# Patient Record
Sex: Male | Born: 1960 | Race: White | Hispanic: No | Marital: Married
Health system: Southern US, Community
[De-identification: ages and names within clinical notes are randomized; demographics above are authoritative.]

---

## 2018-05-21 DIAGNOSIS — I2699 Other pulmonary embolism without acute cor pulmonale: Secondary | ICD-10-CM

## 2018-05-21 DIAGNOSIS — I4891 Unspecified atrial fibrillation: Secondary | ICD-10-CM

## 2018-05-21 DIAGNOSIS — R0602 Shortness of breath: Secondary | ICD-10-CM

## 2018-05-21 DIAGNOSIS — F10239 Alcohol dependence with withdrawal, unspecified: Secondary | ICD-10-CM

## 2018-05-25 DIAGNOSIS — F1010 Alcohol abuse, uncomplicated: Secondary | ICD-10-CM

## 2018-05-25 DIAGNOSIS — I8500 Esophageal varices without bleeding: Secondary | ICD-10-CM

## 2018-05-25 DIAGNOSIS — K766 Portal hypertension: Secondary | ICD-10-CM

## 2018-05-25 DIAGNOSIS — Z720 Tobacco use: Secondary | ICD-10-CM

## 2018-05-25 DIAGNOSIS — D696 Thrombocytopenia, unspecified: Secondary | ICD-10-CM

## 2018-05-25 DIAGNOSIS — K7460 Unspecified cirrhosis of liver: Secondary | ICD-10-CM

## 2018-05-25 DIAGNOSIS — N179 Acute kidney failure, unspecified: Secondary | ICD-10-CM

## 2018-05-25 DIAGNOSIS — Z8719 Personal history of other diseases of the digestive system: Secondary | ICD-10-CM

## 2018-05-25 DIAGNOSIS — R768 Other specified abnormal immunological findings in serum: Secondary | ICD-10-CM

## 2018-05-26 DIAGNOSIS — F1010 Alcohol abuse, uncomplicated: Secondary | ICD-10-CM

## 2018-05-26 DIAGNOSIS — D696 Thrombocytopenia, unspecified: Secondary | ICD-10-CM

## 2018-05-26 DIAGNOSIS — Z720 Tobacco use: Secondary | ICD-10-CM

## 2018-05-26 DIAGNOSIS — I509 Heart failure, unspecified: Secondary | ICD-10-CM

## 2018-05-26 DIAGNOSIS — I4891 Unspecified atrial fibrillation: Secondary | ICD-10-CM

## 2018-05-26 DIAGNOSIS — I2699 Other pulmonary embolism without acute cor pulmonale: Secondary | ICD-10-CM

## 2018-05-26 DIAGNOSIS — K7460 Unspecified cirrhosis of liver: Secondary | ICD-10-CM

## 2018-05-29 DIAGNOSIS — I480 Paroxysmal atrial fibrillation: Secondary | ICD-10-CM

## 2018-05-29 DIAGNOSIS — I2699 Other pulmonary embolism without acute cor pulmonale: Secondary | ICD-10-CM

## 2018-05-29 DIAGNOSIS — I5031 Acute diastolic (congestive) heart failure: Secondary | ICD-10-CM

## 2018-06-03 DIAGNOSIS — I4891 Unspecified atrial fibrillation: Secondary | ICD-10-CM

## 2018-06-03 DIAGNOSIS — I509 Heart failure, unspecified: Secondary | ICD-10-CM

## 2018-09-03 DIAGNOSIS — K7460 Unspecified cirrhosis of liver: Secondary | ICD-10-CM

## 2018-09-03 DIAGNOSIS — I2699 Other pulmonary embolism without acute cor pulmonale: Secondary | ICD-10-CM

## 2018-09-03 DIAGNOSIS — I10 Essential (primary) hypertension: Secondary | ICD-10-CM

## 2018-09-03 DIAGNOSIS — I8500 Esophageal varices without bleeding: Secondary | ICD-10-CM

## 2018-09-03 DIAGNOSIS — I4891 Unspecified atrial fibrillation: Secondary | ICD-10-CM

## 2018-09-03 DIAGNOSIS — D696 Thrombocytopenia, unspecified: Secondary | ICD-10-CM

## 2018-09-03 DIAGNOSIS — I5031 Acute diastolic (congestive) heart failure: Secondary | ICD-10-CM

## 2018-09-03 DIAGNOSIS — F1010 Alcohol abuse, uncomplicated: Secondary | ICD-10-CM | POA: Diagnosis not present

## 2018-09-03 DIAGNOSIS — K766 Portal hypertension: Secondary | ICD-10-CM

## 2018-09-03 DIAGNOSIS — R768 Other specified abnormal immunological findings in serum: Secondary | ICD-10-CM

## 2018-09-04 DIAGNOSIS — I5031 Acute diastolic (congestive) heart failure: Secondary | ICD-10-CM | POA: Diagnosis not present

## 2018-09-04 DIAGNOSIS — F1010 Alcohol abuse, uncomplicated: Secondary | ICD-10-CM | POA: Diagnosis not present

## 2018-09-04 DIAGNOSIS — K7460 Unspecified cirrhosis of liver: Secondary | ICD-10-CM | POA: Diagnosis not present

## 2018-09-04 DIAGNOSIS — I4891 Unspecified atrial fibrillation: Secondary | ICD-10-CM | POA: Diagnosis not present

## 2018-09-05 DIAGNOSIS — F1010 Alcohol abuse, uncomplicated: Secondary | ICD-10-CM | POA: Diagnosis not present

## 2018-09-05 DIAGNOSIS — K7460 Unspecified cirrhosis of liver: Secondary | ICD-10-CM | POA: Diagnosis not present

## 2018-09-05 DIAGNOSIS — I5031 Acute diastolic (congestive) heart failure: Secondary | ICD-10-CM | POA: Diagnosis not present

## 2018-09-05 DIAGNOSIS — I4891 Unspecified atrial fibrillation: Secondary | ICD-10-CM | POA: Diagnosis not present

## 2018-09-06 DIAGNOSIS — K7290 Hepatic failure, unspecified without coma: Secondary | ICD-10-CM

## 2018-09-06 DIAGNOSIS — I5031 Acute diastolic (congestive) heart failure: Secondary | ICD-10-CM | POA: Diagnosis not present

## 2018-09-06 DIAGNOSIS — I480 Paroxysmal atrial fibrillation: Secondary | ICD-10-CM

## 2018-09-06 DIAGNOSIS — F10239 Alcohol dependence with withdrawal, unspecified: Secondary | ICD-10-CM

## 2018-09-06 DIAGNOSIS — N179 Acute kidney failure, unspecified: Secondary | ICD-10-CM

## 2018-09-06 DIAGNOSIS — K767 Hepatorenal syndrome: Secondary | ICD-10-CM | POA: Diagnosis not present

## 2018-09-07 ENCOUNTER — Inpatient Hospital Stay (HOSPITAL_COMMUNITY): Payer: Medicaid Other

## 2018-09-07 ENCOUNTER — Inpatient Hospital Stay (HOSPITAL_COMMUNITY)
Admission: AD | Admit: 2018-09-07 | Discharge: 2018-09-22 | DRG: 207 | Disposition: E | Payer: Medicaid Other | Source: Other Acute Inpatient Hospital | Attending: Pulmonary Disease | Admitting: Pulmonary Disease

## 2018-09-07 DIAGNOSIS — F17200 Nicotine dependence, unspecified, uncomplicated: Secondary | ICD-10-CM | POA: Diagnosis present

## 2018-09-07 DIAGNOSIS — B182 Chronic viral hepatitis C: Secondary | ICD-10-CM | POA: Diagnosis present

## 2018-09-07 DIAGNOSIS — E871 Hypo-osmolality and hyponatremia: Secondary | ICD-10-CM

## 2018-09-07 DIAGNOSIS — J9690 Respiratory failure, unspecified, unspecified whether with hypoxia or hypercapnia: Secondary | ICD-10-CM

## 2018-09-07 DIAGNOSIS — K439 Ventral hernia without obstruction or gangrene: Secondary | ICD-10-CM | POA: Diagnosis present

## 2018-09-07 DIAGNOSIS — Z515 Encounter for palliative care: Secondary | ICD-10-CM | POA: Diagnosis present

## 2018-09-07 DIAGNOSIS — A419 Sepsis, unspecified organism: Secondary | ICD-10-CM | POA: Diagnosis not present

## 2018-09-07 DIAGNOSIS — K7031 Alcoholic cirrhosis of liver with ascites: Secondary | ICD-10-CM | POA: Diagnosis present

## 2018-09-07 DIAGNOSIS — Z452 Encounter for adjustment and management of vascular access device: Secondary | ICD-10-CM | POA: Diagnosis not present

## 2018-09-07 DIAGNOSIS — Z9189 Other specified personal risk factors, not elsewhere classified: Secondary | ICD-10-CM

## 2018-09-07 DIAGNOSIS — R161 Splenomegaly, not elsewhere classified: Secondary | ICD-10-CM | POA: Diagnosis present

## 2018-09-07 DIAGNOSIS — Z978 Presence of other specified devices: Secondary | ICD-10-CM

## 2018-09-07 DIAGNOSIS — J9601 Acute respiratory failure with hypoxia: Principal | ICD-10-CM

## 2018-09-07 DIAGNOSIS — I5032 Chronic diastolic (congestive) heart failure: Secondary | ICD-10-CM | POA: Diagnosis present

## 2018-09-07 DIAGNOSIS — N179 Acute kidney failure, unspecified: Secondary | ICD-10-CM | POA: Diagnosis present

## 2018-09-07 DIAGNOSIS — E872 Acidosis: Secondary | ICD-10-CM | POA: Diagnosis present

## 2018-09-07 DIAGNOSIS — K767 Hepatorenal syndrome: Secondary | ICD-10-CM | POA: Diagnosis not present

## 2018-09-07 DIAGNOSIS — Z86711 Personal history of pulmonary embolism: Secondary | ICD-10-CM | POA: Diagnosis not present

## 2018-09-07 DIAGNOSIS — J449 Chronic obstructive pulmonary disease, unspecified: Secondary | ICD-10-CM | POA: Diagnosis present

## 2018-09-07 DIAGNOSIS — E119 Type 2 diabetes mellitus without complications: Secondary | ICD-10-CM | POA: Diagnosis present

## 2018-09-07 DIAGNOSIS — Z9119 Patient's noncompliance with other medical treatment and regimen: Secondary | ICD-10-CM

## 2018-09-07 DIAGNOSIS — D539 Nutritional anemia, unspecified: Secondary | ICD-10-CM | POA: Diagnosis present

## 2018-09-07 DIAGNOSIS — Z66 Do not resuscitate: Secondary | ICD-10-CM | POA: Diagnosis present

## 2018-09-07 DIAGNOSIS — I8500 Esophageal varices without bleeding: Secondary | ICD-10-CM | POA: Diagnosis present

## 2018-09-07 DIAGNOSIS — R579 Shock, unspecified: Secondary | ICD-10-CM | POA: Diagnosis present

## 2018-09-07 DIAGNOSIS — I480 Paroxysmal atrial fibrillation: Secondary | ICD-10-CM

## 2018-09-07 DIAGNOSIS — Z9289 Personal history of other medical treatment: Secondary | ICD-10-CM

## 2018-09-07 DIAGNOSIS — Z9114 Patient's other noncompliance with medication regimen: Secondary | ICD-10-CM

## 2018-09-07 DIAGNOSIS — F10239 Alcohol dependence with withdrawal, unspecified: Secondary | ICD-10-CM | POA: Diagnosis present

## 2018-09-07 DIAGNOSIS — I361 Nonrheumatic tricuspid (valve) insufficiency: Secondary | ICD-10-CM | POA: Diagnosis not present

## 2018-09-07 DIAGNOSIS — N400 Enlarged prostate without lower urinary tract symptoms: Secondary | ICD-10-CM | POA: Diagnosis present

## 2018-09-07 DIAGNOSIS — I110 Hypertensive heart disease with heart failure: Secondary | ICD-10-CM | POA: Diagnosis present

## 2018-09-07 DIAGNOSIS — R6521 Severe sepsis with septic shock: Secondary | ICD-10-CM | POA: Diagnosis not present

## 2018-09-07 DIAGNOSIS — K7040 Alcoholic hepatic failure without coma: Secondary | ICD-10-CM | POA: Diagnosis present

## 2018-09-07 DIAGNOSIS — I272 Pulmonary hypertension, unspecified: Secondary | ICD-10-CM | POA: Diagnosis present

## 2018-09-07 DIAGNOSIS — Z6841 Body Mass Index (BMI) 40.0 and over, adult: Secondary | ICD-10-CM

## 2018-09-07 DIAGNOSIS — Z9911 Dependence on respirator [ventilator] status: Secondary | ICD-10-CM

## 2018-09-07 DIAGNOSIS — I5031 Acute diastolic (congestive) heart failure: Secondary | ICD-10-CM | POA: Diagnosis not present

## 2018-09-07 DIAGNOSIS — R0602 Shortness of breath: Secondary | ICD-10-CM | POA: Diagnosis present

## 2018-09-07 DIAGNOSIS — D696 Thrombocytopenia, unspecified: Secondary | ICD-10-CM | POA: Diagnosis present

## 2018-09-07 DIAGNOSIS — L03311 Cellulitis of abdominal wall: Secondary | ICD-10-CM | POA: Diagnosis not present

## 2018-09-07 DIAGNOSIS — K7290 Hepatic failure, unspecified without coma: Secondary | ICD-10-CM | POA: Diagnosis not present

## 2018-09-07 DIAGNOSIS — Z8673 Personal history of transient ischemic attack (TIA), and cerebral infarction without residual deficits: Secondary | ICD-10-CM

## 2018-09-07 LAB — PROTIME-INR
INR: 2.01
Prothrombin Time: 22.5 seconds — ABNORMAL HIGH (ref 11.4–15.2)

## 2018-09-07 LAB — BLOOD GAS, ARTERIAL
Acid-Base Excess: 1.1 mmol/L (ref 0.0–2.0)
Bicarbonate: 25.9 mmol/L (ref 20.0–28.0)
Drawn by: 252031
FIO2: 60
LHR: 20 {breaths}/min
O2 Saturation: 86.8 %
PEEP: 5 cmH2O
Patient temperature: 94
Pressure control: 24 cmH2O
pCO2 arterial: 41.6 mmHg (ref 32.0–48.0)
pH, Arterial: 7.397 (ref 7.350–7.450)
pO2, Arterial: 49.1 mmHg — ABNORMAL LOW (ref 83.0–108.0)

## 2018-09-07 LAB — CBC WITH DIFFERENTIAL/PLATELET
Abs Immature Granulocytes: 0.04 10*3/uL (ref 0.00–0.07)
Basophils Absolute: 0 10*3/uL (ref 0.0–0.1)
Basophils Relative: 1 %
Eosinophils Absolute: 0.1 10*3/uL (ref 0.0–0.5)
Eosinophils Relative: 2 %
HCT: 32.2 % — ABNORMAL LOW (ref 39.0–52.0)
Hemoglobin: 10.6 g/dL — ABNORMAL LOW (ref 13.0–17.0)
Immature Granulocytes: 1 %
LYMPHS PCT: 10 %
Lymphs Abs: 0.6 10*3/uL — ABNORMAL LOW (ref 0.7–4.0)
MCH: 36.8 pg — ABNORMAL HIGH (ref 26.0–34.0)
MCHC: 32.9 g/dL (ref 30.0–36.0)
MCV: 111.8 fL — ABNORMAL HIGH (ref 80.0–100.0)
Monocytes Absolute: 1.1 10*3/uL — ABNORMAL HIGH (ref 0.1–1.0)
Monocytes Relative: 18 %
NEUTROS PCT: 68 %
Neutro Abs: 4.1 10*3/uL (ref 1.7–7.7)
Platelets: 103 10*3/uL — ABNORMAL LOW (ref 150–400)
RBC: 2.88 MIL/uL — ABNORMAL LOW (ref 4.22–5.81)
RDW: 16.9 % — ABNORMAL HIGH (ref 11.5–15.5)
WBC: 5.9 10*3/uL (ref 4.0–10.5)
nRBC: 0 % (ref 0.0–0.2)

## 2018-09-07 LAB — ABO/RH: ABO/RH(D): O POS

## 2018-09-07 LAB — COMPREHENSIVE METABOLIC PANEL
ALT: 21 U/L (ref 0–44)
AST: 32 U/L (ref 15–41)
Albumin: 3.5 g/dL (ref 3.5–5.0)
Alkaline Phosphatase: 77 U/L (ref 38–126)
Anion gap: 13 (ref 5–15)
BUN: 26 mg/dL — ABNORMAL HIGH (ref 6–20)
CO2: 25 mmol/L (ref 22–32)
CREATININE: 2.64 mg/dL — AB (ref 0.61–1.24)
Calcium: 8.4 mg/dL — ABNORMAL LOW (ref 8.9–10.3)
Chloride: 96 mmol/L — ABNORMAL LOW (ref 98–111)
GFR calc Af Amer: 30 mL/min — ABNORMAL LOW (ref >60)
GFR calc non Af Amer: 26 mL/min — ABNORMAL LOW (ref >60)
Glucose, Bld: 121 mg/dL — ABNORMAL HIGH (ref 70–99)
Potassium: 4.4 mmol/L (ref 3.5–5.1)
Sodium: 134 mmol/L — ABNORMAL LOW (ref 135–145)
Total Bilirubin: 3.8 mg/dL — ABNORMAL HIGH (ref 0.3–1.2)
Total Protein: 7.5 g/dL (ref 6.5–8.1)

## 2018-09-07 LAB — APTT: aPTT: 39 seconds — ABNORMAL HIGH (ref 24–36)

## 2018-09-07 LAB — GLUCOSE, CAPILLARY: GLUCOSE-CAPILLARY: 121 mg/dL — AB (ref 70–99)

## 2018-09-07 LAB — MAGNESIUM: Magnesium: 2.6 mg/dL — ABNORMAL HIGH (ref 1.7–2.4)

## 2018-09-07 LAB — TYPE AND SCREEN
ABO/RH(D): O POS
ANTIBODY SCREEN: NEGATIVE

## 2018-09-07 LAB — LACTIC ACID, PLASMA: Lactic Acid, Venous: 1.4 mmol/L (ref 0.5–1.9)

## 2018-09-07 LAB — TROPONIN I: Troponin I: <0.03 ng/mL (ref <0.03)

## 2018-09-07 LAB — PHOSPHORUS: Phosphorus: 5.1 mg/dL — ABNORMAL HIGH (ref 2.5–4.6)

## 2018-09-07 MED ORDER — IPRATROPIUM-ALBUTEROL 0.5-2.5 (3) MG/3ML IN SOLN
3.0000 mL | Freq: Four times a day (QID) | RESPIRATORY_TRACT | Status: DC
  Administered 2018-09-07 – 2018-09-10 (×10): 3 mL via RESPIRATORY_TRACT
  Filled 2018-09-07 (×10): qty 3

## 2018-09-07 MED ORDER — FENTANYL 2500MCG IN NS 250ML (10MCG/ML) PREMIX INFUSION
25.0000 ug/h | INTRAVENOUS | Status: DC
  Administered 2018-09-07: 50 ug/h via INTRAVENOUS
  Filled 2018-09-07: qty 250

## 2018-09-07 MED ORDER — LACTULOSE ENEMA
300.0000 mL | Freq: Two times a day (BID) | ORAL | Status: DC
  Administered 2018-09-07 – 2018-09-13 (×12): 300 mL via RECTAL
  Filled 2018-09-07 (×12): qty 300

## 2018-09-07 MED ORDER — NICOTINE 21 MG/24HR TD PT24
21.0000 mg | MEDICATED_PATCH | Freq: Every day | TRANSDERMAL | Status: DC
  Administered 2018-09-08: 21 mg via TRANSDERMAL
  Filled 2018-09-07: qty 1

## 2018-09-07 MED ORDER — FOLIC ACID 5 MG/ML IJ SOLN
1.0000 mg | Freq: Every day | INTRAMUSCULAR | Status: DC
  Administered 2018-09-08 – 2018-09-13 (×6): 1 mg via INTRAVENOUS
  Filled 2018-09-07 (×7): qty 0.2

## 2018-09-07 MED ORDER — NOREPINEPHRINE 4 MG/250ML-% IV SOLN
0.0000 ug/min | INTRAVENOUS | Status: DC
  Administered 2018-09-07: 2 ug/min via INTRAVENOUS
  Filled 2018-09-07 (×3): qty 250

## 2018-09-07 MED ORDER — FENTANYL CITRATE (PF) 100 MCG/2ML IJ SOLN: 50.0000 ug | Freq: Once | INTRAMUSCULAR | Status: DC

## 2018-09-07 MED ORDER — DOCUSATE SODIUM 50 MG/5ML PO LIQD
100.0000 mg | Freq: Two times a day (BID) | ORAL | Status: DC | PRN
  Administered 2018-09-11: 100 mg
  Filled 2018-09-07: qty 10

## 2018-09-07 MED ORDER — THIAMINE HCL 100 MG/ML IJ SOLN
100.0000 mg | Freq: Every day | INTRAMUSCULAR | Status: DC
  Administered 2018-09-08 – 2018-09-13 (×6): 100 mg via INTRAVENOUS
  Filled 2018-09-07 (×6): qty 2

## 2018-09-07 MED ORDER — FENTANYL 2500MCG IN NS 250ML (10MCG/ML) PREMIX INFUSION
25.0000 ug/h | INTRAVENOUS | Status: DC
  Administered 2018-09-08: 250 ug/h via INTRAVENOUS
  Administered 2018-09-09 (×2): 400 ug/h via INTRAVENOUS
  Administered 2018-09-09: 300 ug/h via INTRAVENOUS
  Administered 2018-09-10 (×3): 400 ug/h via INTRAVENOUS
  Administered 2018-09-11: 350 ug/h via INTRAVENOUS
  Administered 2018-09-11 – 2018-09-13 (×6): 400 ug/h via INTRAVENOUS
  Filled 2018-09-07 (×18): qty 250

## 2018-09-07 MED ORDER — FENTANYL BOLUS VIA INFUSION
50.0000 ug | INTRAVENOUS | Status: DC | PRN
  Filled 2018-09-07: qty 50

## 2018-09-07 MED ORDER — MIDAZOLAM HCL 2 MG/2ML IJ SOLN
INTRAMUSCULAR | Status: AC
  Administered 2018-09-07: 2 mg
  Filled 2018-09-07: qty 2

## 2018-09-07 MED ORDER — SODIUM CHLORIDE 0.9 % IV SOLN
2.0000 g | INTRAVENOUS | Status: DC
  Administered 2018-09-07 – 2018-09-12 (×6): 2 g via INTRAVENOUS
  Filled 2018-09-07 (×6): qty 20

## 2018-09-07 MED ORDER — FAMOTIDINE IN NACL 20-0.9 MG/50ML-% IV SOLN
20.0000 mg | Freq: Two times a day (BID) | INTRAVENOUS | Status: DC
  Filled 2018-09-07: qty 50

## 2018-09-07 MED ORDER — PANTOPRAZOLE SODIUM 40 MG IV SOLR
40.0000 mg | Freq: Two times a day (BID) | INTRAVENOUS | Status: DC
  Administered 2018-09-08 – 2018-09-13 (×12): 40 mg via INTRAVENOUS
  Filled 2018-09-07 (×12): qty 40

## 2018-09-07 MED ORDER — MIDAZOLAM HCL 2 MG/2ML IJ SOLN
2.0000 mg | Freq: Once | INTRAMUSCULAR | Status: AC
  Administered 2018-09-07: 2 mg via INTRAVENOUS

## 2018-09-07 MED ORDER — FENTANYL CITRATE (PF) 100 MCG/2ML IJ SOLN
50.0000 ug | Freq: Once | INTRAMUSCULAR | Status: AC
  Administered 2018-09-07: 50 ug via INTRAVENOUS

## 2018-09-07 MED ORDER — HEPARIN SODIUM (PORCINE) 5000 UNIT/ML IJ SOLN: 5000.0000 [IU] | Freq: Three times a day (TID) | INTRAMUSCULAR | Status: DC

## 2018-09-07 MED ORDER — NOREPINEPHRINE 4 MG/250ML-% IV SOLN
INTRAVENOUS | Status: AC
  Filled 2018-09-07: qty 250

## 2018-09-07 MED ORDER — MIDAZOLAM HCL 2 MG/2ML IJ SOLN
0.5000 mg | INTRAMUSCULAR | Status: DC | PRN
  Administered 2018-09-08 – 2018-09-12 (×6): 0.5 mg via INTRAVENOUS
  Filled 2018-09-07 (×8): qty 2

## 2018-09-07 MED ORDER — MIDAZOLAM HCL 2 MG/2ML IJ SOLN
1.0000 mg | INTRAMUSCULAR | Status: AC | PRN
  Administered 2018-09-07 – 2018-09-08 (×3): 1 mg via INTRAVENOUS
  Filled 2018-09-07 (×4): qty 2

## 2018-09-07 MED ORDER — FENTANYL BOLUS VIA INFUSION
50.0000 ug | INTRAVENOUS | Status: DC | PRN
  Administered 2018-09-07 – 2018-09-12 (×5): 50 ug via INTRAVENOUS
  Filled 2018-09-07: qty 50

## 2018-09-07 NOTE — Significant Event (Addendum)
Critical Care:  Patient [facility] from [facility] for suspected hepatic encephalopathy and hepatorenal syndrome MELD score 25. Hepatitis C and alcoholic cirrhosis with active drinking and no hepatology follow up.  Lynnell Catalanavi Zaley Talley, MD [facility] ICU Physician [facility]  Pager: [phone] Mobile: [phone] After hours: [phone].  08-23-2018, 6:14 PM

## 2018-09-07 NOTE — Progress Notes (Signed)
eLink Physician-Brief Progress Note Patient Name: Chipper HerbRobert Terrero DOB: [birth year] MRN: [id number]   Date of Service  06/09/18  HPI/Events of Note  57/M with liver disease, continued alcohol use, transferred from an outside facility for hepatic encephalopathy and possible hepatorenal syndrome.   Pt is intubated and breathing comfortably with the vent.  Labs not available for review.  eICU Interventions  Recheck labs - LFTs, CMP, ammonia.   Sedated while on the vent.  GI consult.     Intervention Category Evaluation Type: New Patient Evaluation  Larinda ButteryVanessa Jeyli Zwicker 06/09/18, 9:18 PM

## 2018-09-07 NOTE — Progress Notes (Signed)
ANTICOAGULATION CONSULT NOTE - Initial Consult  Pharmacy Consult for Heparin (Apixaban on hold) Indication: Atrial fibrillation, history of PE  Allergies not on file  Patient Measurements: 161.5 kg per RN  Vital Signs: Pulse Rate: 82 (12/17 2346)  Labs: Recent Labs    09/12/2018 2216  APTT 39*  LABPROT 22.5*  INR 2.01  CREATININE 2.64*  TROPONINI <0.03    CrCl cannot be calculated (Unknown ideal weight.).  Assessment: 57 y/o M transfer [facility] with likely worsening hepatic encephalopathy. He was on Apixaban PTA with questionable compliance. [facility] [place] getting [facility] [facility] with last dose 12/16 at 2038. Will likely need to use aPTT to dose given apixaban influence on heparin levels.   Goal of Therapy:  Heparin level 0.3-0.7 units/ml aPTT 66-102 seconds Monitor platelets by anticoagulation protocol: Yes   Plan:  Start heparin drip at 1400 units/hr 0830 aPTT/HL Daily CBC/HL/aPTT Monitor for bleeding   Abran DukeLedford, Oprah Camarena 09/08/2018,11:54 PM

## 2018-09-07 NOTE — Procedures (Signed)
Central Venous Catheter Insertion Procedure Note Jose Norris [id number] July 02, 1961  Procedure: Insertion of Central Venous Catheter Indications: Assessment of intravascular volume, Drug and/or fluid administration and Frequent blood sampling  Procedure Details Consent: Unable to obtain consent because of altered level of consciousness. Time Out: Verified patient identification, verified procedure, site/side was marked, verified correct patient position, special equipment/implants available, medications/allergies/relevent history reviewed, required imaging and test results available.  Performed  Maximum sterile technique was used including antiseptics, cap, gloves, gown, hand hygiene, mask and sheet. Skin prep: Chlorhexidine; local anesthetic administered A antimicrobial bonded/coated triple lumen catheter was placed in the left internal jugular vein using the Seldinger technique.  Evaluation Blood flow good Complications: No apparent complications Patient did tolerate procedure well. Chest X-ray ordered to verify placement.  CXR: pending.  Procedure performed under direct ultrasound guidance for real time vessel cannulation.      Jose Norris, [place] - [facility] Pager: [phone]  or [phone] 09/21/2018, 10:09 PM

## 2018-09-07 NOTE — H&P (Addendum)
..   NAME:  Jose HerbRobert Beadle, MRN:  [id number], DOB:  Nov 11, 1960, LOS: 0 ADMISSION DATE:  08/31/2018, CONSULTATION DATE:  09/01/2018 REFERRING MD:  DANFORD,CHRISTOPHER MD, CHIEF COMPLAINT:  RESP DISTRESS   Brief History   57 yr old M [facility]  Was admitted there on 09/03/18. Was being managed for progressive shortness of breath. Pt ran out of his fluid pills and borrowed someone else's but it was not effective. He was noted to have weight gain and palpitations for a week prior to presentation.   History of present illness   57 yr old M with PMHx Alcoholic liver cirrhosis, chronic Hep C, Esophageal varices, Afib, PE on Eliquis (per the chart not compliant with it for 3-4 weeks- per family patient had no health insurance and recently received Medicaid and was able to be seen at the hospital), diastolic CHF, HTN and DM presenting [facility]. Was admitted there on 09/03/18 for progressive shortness of breath and found to be in fluid overload. He ran out of his diuretic and borrowed someone else's and it was not working.  Upon presentation there he was in Afib RVR and started on cardizem gtt. He also received a large volume paracentesis followed by albumn 25 g IV x 1.  While in patient he was improving and was in stepdown when he became agitated and combative - suspected ETOH withdrawal . He received Haldol and Ativan at that time and was started on CIWA protocol. Per their report patient's respiratory status prior to this was tenuous and became progressively worse requiring ntubation.   Past Medical History  Alcoholic Liver cirrhosis Hepatitis C- viral load increased in comparison to prior Esophageal varices- EGD 05/25/18 PE- previously on Eliquis LE DVTs Atrial fibrillation(paroxysmal) DM HTN Diastolic CHF BPH  Significant Hospital Events   Prior to transport acute hypoxic resp failure - endotracheally intubated  Consults:  None at this time  Procedures:    Paracentesis - 09/03/18 in [facility] 60 cc for diagnostic evaluation followed by 5.5L and received one dose of 25g albumin at that time.  Significant Diagnostic Tests:  CXR: Endotracheal tube tip just below the level of the clavicles. Enteric tube side port projects over the stomach. Small left pleural effusion and left basilar consolidation.  Micro Data:  Pending Paracentesis results from [facility]- per Rn cx was negative. Cell count ( mod WBC) no numbers per RN  - blood cx x 2 - 12/17 - tracheal cx - 12/17  Antimicrobials:  - paper record does not indicate any previous abx- confirmed no anx at [facility] with RN there   - Ceftriaxone: 08/30/2018   Objective   There were no vitals taken for this visit.       No intake or output data in the 24 hours ending 09/14/2018 2125 There were no vitals filed for this visit.  Examination: General: sedated intubated HENT: NCAT with ETT and OGT in place  Lungs: coarse breath sounds bilaterally Cardiovascular: S1 and S2 irregularly irregular Abdomen: distended soft w/ + fluid wave hypoactive BS, overlying skin erythematous with dimpling and warm to the touch Extremities: +3 edema in lower extremities with erythema Neuro: GCS E 4 VNT M 5 GU: indwelling foley catheter with dark red urine.     Assessment & Plan:  Acute hypoxic respiratory failure: - intubated continues on mechanical ventilation - [facility] 7.44/41/68/29 - A/C TV 600/RR 16/ 50/5 - much more synchronous on PC on my evaluation - ABG w/in 1 hr to  assess oxygenation ventilation status  - VAPrecautions  - CXR completed ETT position post transport ok and right perihilar consolidation noted.  - h/o COPD will start on bronchodilators Q 6 H/o smoking - nicotine replacement via patches  Hypotensive on presentation- ? Septic shock - stopped propofol gtt - MAP <25mmhg will require vasopressors will continue on cardiac monitoring - LIJ CVC - monitor CVP  - currently in Afib -  trace pericardial effusion noted on previous imaging - get 2D ECHO to assess LVEF - will hold spironolactone/flomax/beta blockers given his hypotension  Liver cirrhosis (combination of alcohol induced injury and hep C) - on CT compared to Aug 2019 unchanged nodular hepatic contour noted w- MELD 23 (19.6% 3 month mortality)  - Hepatic encephalopathy  last Ammonia prior to transfer was 89 will start on Lactulose Enema BID. - Last INR 1.5 no active bleeding - no need for FFP at this time - Discriminative function. 24- based on his PT 15.5 Bilirubin 3- no indication for glucocorticoid therapy at this time  Hepatitis C h/o with increased viral load- if pt improves will need GI f/u as an outpatient.  H/o Alcoholism  Time of last drink prior to admission at [place]: noted to still be heavily drinking 8 beers/day  CIWA protocol (he was agitated and confused earlier and required Ativan and Haldol- of note QTc is prolonged- currently on Sedation protocol with Fentanyl gtt and Versed  Macrocytic anemia - hgb 10.7 MCV 107 most likely secondary to alcoholism - start on thiamine and folic acid Mod to large ascites with mesenteric edema and received paracentesis @ [place] ED- they sent cytology, cell count, gram stain and cultures. 5.5 L removed and pt received Albumin post We may reconsider repeating if pt's clinical status declines.  H/o esophageal varices  p/w OGT placed at [facility]  no evidence of hematemesis or bleeding from OG noted Was prev on octreotide- will discontinue at this time ( no evidence of active GIB) will start on PPI.  No beta blockers at this time given hypotension.   Hypoosmolar Hyponatremia: h/o beer drinking. Na had improved during admission to [place]. F/u Na on BMET.   Thrombocytopenia  - splenomegaly on CT Abd/pelvis - and in setting of cirrhosis - will continue to monitor - last plt count   Atrial Fibrillation (paroxysmal) - was previously in RVR at [place]  and required a Cardizem gtt. Did not tolerate drip and received IVF  Currently HR <80bpm  was transitioned to PO Cardizem 60 Q 6 prior to his decline. Will assess hemodynamics and resume Cardizem when MAP improves.  - CHADS2VASC- 4 ( HTN, diastolic CHF h/o PE and Diabetes in h/o) - high risk of stroke/TIA/Embolism- will need AC- starting on heparin gtt per pharmacy (was on Xarelto as an outpatient) if there is any drop in Hgb or signs of active bleeding we will discontinue AC given risk/benefit. - QTc prolonged stopped Propofol gtt which was started in transport - was on Cardizem   Acute kidney Injury Last Cr increased to 2.0 f/u rpt on labs F/u UOP  Non Anion Gap Metabolic Acidosis  AG corrected for Albumin 3.4 ->12.5 No diarrhea per chart/ report Has an OGT- was on intermittent suction- placed to gravity Has an indwelling foley with decreased output and noticeable hematuria with lots of sediment Send UA and C& S Has a h/o BPH will hold flomax at this time given his hypotension  Abdomen ?cellulitis - skin overlying lower pannus erythematous warm and suspicious for  cellulitis - monitor closely for extension/expanison - f/u blood cx - pt starting on ceftriaxone for SBP coverage will also cover possible gram + that may be reason for soft tissue infection    Best practice:  Diet: NPO Pain/Anxiety/Delirium protocol (if indicated): Fentanyl continuous and Versed  VAP protocol (if indicated): yes DVT prophylaxis: heparin gtt no bolus GI prophylaxis: PPI Glucose control: yes ISS Phase 1 protocol Mobility: bedrest Code Status: FULL Family Communication: discussed with sister at bedside Disposition:   Labs   CBC: No results for input(s): WBC, NEUTROABS, HGB, HCT, MCV, PLT in the last 168 hours.  Basic Metabolic Panel: No results for input(s): NA, K, CL, CO2, GLUCOSE, BUN, CREATININE, CALCIUM, MG, PHOS in the last 168 hours. GFR: CrCl cannot be calculated (No successful lab  value found.). No results for input(s): PROCALCITON, WBC, LATICACIDVEN in the last 168 hours.  Liver Function Tests: No results for input(s): AST, ALT, ALKPHOS, BILITOT, PROT, ALBUMIN in the last 168 hours. No results for input(s): LIPASE, AMYLASE in the last 168 hours. No results for input(s): AMMONIA in the last 168 hours.  ABG No results found for: PHART, PCO2ART, PO2ART, HCO3, TCO2, ACIDBASEDEF, O2SAT   Coagulation Profile: No results for input(s): INR, PROTIME in the last 168 hours.  Cardiac Enzymes: No results for input(s): CKTOTAL, CKMB, CKMBINDEX, TROPONINI in the last 168 hours.  HbA1C: No results found for: HGBA1C  CBG: Recent Labs  Lab 09/03/2018 2105  GLUCAP 121*    Review of Systems:   Marland KitchenMarland KitchenReview of Systems  Unable to perform ROS: Critical illness   Past Medical History  Alcoholic Liver cirrhosis Hepatitis C- viral load increased in comparison to prior Esophageal varices- EGD 05/25/18 PE- previously on Eliquis LE DVTs Atrial fibrillation(paroxysmal) DM HTN Diastolic CHF BPH  Surgical History   Pending.   Social History    Actively drinking for several years- 8 beers/day Smoker Married  Family History   His family history is not on file.   Allergies Allergies not on file   Home Medications  Prior to Admission medications   Not on File     Critical care time: 108 mins     I, Dr Newell Coral have personally reviewed patient's available data, including medical history, events of note, physical examination and test results as part of my evaluation. I have discussed with  other care providers such as pharmacist, PA and RN.    The patient is critically ill with multiple organ systems failure and requires high complexity decision making for assessment and support, frequent evaluation and titration of therapies, application of advanced monitoring technologies and extensive interpretation of multiple databases.   Critical Care Time devoted to  patient care services described in this note is 108 Minutes. This time reflects time of care of this signee Dr Newell Coral. This critical care time does not reflect procedure time, or teaching time or supervisory time of PA  but could involve care discussion time   Dr. Newell Coral Pulmonary Critical Care Medicine  09/14/2018 11:31 PM

## 2018-09-08 ENCOUNTER — Other Ambulatory Visit (HOSPITAL_COMMUNITY): Payer: Medicaid Other

## 2018-09-08 LAB — HEPATIC FUNCTION PANEL
ALT: 19 U/L (ref 0–44)
AST: 32 U/L (ref 15–41)
Albumin: 3.1 g/dL — ABNORMAL LOW (ref 3.5–5.0)
Alkaline Phosphatase: 68 U/L (ref 38–126)
Bilirubin, Direct: 1.1 mg/dL — ABNORMAL HIGH (ref 0.0–0.2)
Indirect Bilirubin: 2.1 mg/dL — ABNORMAL HIGH (ref 0.3–0.9)
Total Bilirubin: 3.2 mg/dL — ABNORMAL HIGH (ref 0.3–1.2)
Total Protein: 7.3 g/dL (ref 6.5–8.1)

## 2018-09-08 LAB — BASIC METABOLIC PANEL
Anion gap: 11 (ref 5–15)
BUN: 28 mg/dL — ABNORMAL HIGH (ref 6–20)
CO2: 26 mmol/L (ref 22–32)
CREATININE: 2.41 mg/dL — AB (ref 0.61–1.24)
Calcium: 8.3 mg/dL — ABNORMAL LOW (ref 8.9–10.3)
Chloride: 96 mmol/L — ABNORMAL LOW (ref 98–111)
GFR calc Af Amer: 34 mL/min — ABNORMAL LOW (ref >60)
GFR calc non Af Amer: 29 mL/min — ABNORMAL LOW (ref >60)
GLUCOSE: 110 mg/dL — AB (ref 70–99)
Potassium: 4.2 mmol/L (ref 3.5–5.1)
Sodium: 133 mmol/L — ABNORMAL LOW (ref 135–145)

## 2018-09-08 LAB — TROPONIN I
Troponin I: 0.03 ng/mL (ref <0.03)
Troponin I: <0.03 ng/mL (ref <0.03)

## 2018-09-08 LAB — GLUCOSE, CAPILLARY
GLUCOSE-CAPILLARY: 102 mg/dL — AB (ref 70–99)
Glucose-Capillary: 124 mg/dL — ABNORMAL HIGH (ref 70–99)

## 2018-09-08 LAB — BLOOD GAS, ARTERIAL
Acid-Base Excess: 2.4 mmol/L — ABNORMAL HIGH (ref 0.0–2.0)
Bicarbonate: 26.5 mmol/L (ref 20.0–28.0)
DRAWN BY: 44166
FIO2: 60
O2 Saturation: 92.3 %
PEEP: 5 cmH2O
Patient temperature: 96.8
Pressure control: 20 cmH2O
RATE: 20 resp/min
pCO2 arterial: 39.9 mmHg (ref 32.0–48.0)
pH, Arterial: 7.433 (ref 7.350–7.450)
pO2, Arterial: 62.8 mmHg — ABNORMAL LOW (ref 83.0–108.0)

## 2018-09-08 LAB — HEPARIN LEVEL (UNFRACTIONATED): Heparin Unfractionated: 1.84 IU/mL — ABNORMAL HIGH (ref 0.30–0.70)

## 2018-09-08 LAB — APTT
aPTT: 57 seconds — ABNORMAL HIGH (ref 24–36)
aPTT: 121 seconds — ABNORMAL HIGH (ref 24–36)

## 2018-09-08 LAB — HEMOGLOBIN A1C
Hgb A1c MFr Bld: 4.8 % (ref 4.8–5.6)
Mean Plasma Glucose: 91.06 mg/dL

## 2018-09-08 LAB — MAGNESIUM
MAGNESIUM: 2.6 mg/dL — AB (ref 1.7–2.4)
Magnesium: 2.5 mg/dL — ABNORMAL HIGH (ref 1.7–2.4)
Magnesium: 2.7 mg/dL — ABNORMAL HIGH (ref 1.7–2.4)

## 2018-09-08 LAB — CBC
HCT: 31.4 % — ABNORMAL LOW (ref 39.0–52.0)
Hemoglobin: 10.1 g/dL — ABNORMAL LOW (ref 13.0–17.0)
MCH: 35.6 pg — AB (ref 26.0–34.0)
MCHC: 32.2 g/dL (ref 30.0–36.0)
MCV: 110.6 fL — ABNORMAL HIGH (ref 80.0–100.0)
Platelets: 101 10*3/uL — ABNORMAL LOW (ref 150–400)
RBC: 2.84 MIL/uL — ABNORMAL LOW (ref 4.22–5.81)
RDW: 16.5 % — ABNORMAL HIGH (ref 11.5–15.5)
WBC: 6.7 10*3/uL (ref 4.0–10.5)
nRBC: 0 % (ref 0.0–0.2)

## 2018-09-08 LAB — PHOSPHORUS
PHOSPHORUS: 5.1 mg/dL — AB (ref 2.5–4.6)
Phosphorus: 5.6 mg/dL — ABNORMAL HIGH (ref 2.5–4.6)
Phosphorus: 6.3 mg/dL — ABNORMAL HIGH (ref 2.5–4.6)

## 2018-09-08 LAB — POCT I-STAT 3, ART BLOOD GAS (G3+)
ACID-BASE DEFICIT: 1 mmol/L (ref 0.0–2.0)
Bicarbonate: 28.5 mmol/L — ABNORMAL HIGH (ref 20.0–28.0)
O2 Saturation: 95 %
Patient temperature: 98.6
TCO2: 31 mmol/L (ref 22–32)
pCO2 arterial: 70.9 mmHg (ref 32.0–48.0)
pH, Arterial: 7.212 — ABNORMAL LOW (ref 7.350–7.450)
pO2, Arterial: 97 mmHg (ref 83.0–108.0)

## 2018-09-08 LAB — MRSA PCR SCREENING: MRSA by PCR: NEGATIVE

## 2018-09-08 LAB — LACTIC ACID, PLASMA: Lactic Acid, Venous: 1.3 mmol/L (ref 0.5–1.9)

## 2018-09-08 LAB — HIV ANTIBODY (ROUTINE TESTING W REFLEX): HIV Screen 4th Generation wRfx: NONREACTIVE

## 2018-09-08 LAB — AMMONIA: Ammonia: 34 umol/L (ref 9–35)

## 2018-09-08 MED ORDER — ADULT MULTIVITAMIN LIQUID CH
15.0000 mL | Freq: Every day | ORAL | Status: DC
  Administered 2018-09-08 – 2018-09-13 (×6): 15 mL via ORAL
  Filled 2018-09-08 (×6): qty 15

## 2018-09-08 MED ORDER — LORAZEPAM 2 MG/ML IJ SOLN
1.0000 mg | INTRAMUSCULAR | Status: DC | PRN
  Administered 2018-09-08 – 2018-09-12 (×18): 2 mg via INTRAVENOUS
  Filled 2018-09-08 (×21): qty 1

## 2018-09-08 MED ORDER — DIGOXIN 0.25 MG/ML IJ SOLN
0.2500 mg | Freq: Once | INTRAMUSCULAR | Status: AC
  Administered 2018-09-08: 0.25 mg via INTRAVENOUS
  Filled 2018-09-08: qty 2

## 2018-09-08 MED ORDER — VITAL HIGH PROTEIN PO LIQD
1000.0000 mL | ORAL | Status: DC
  Administered 2018-09-08 – 2018-09-10 (×2): 1000 mL

## 2018-09-08 MED ORDER — METOPROLOL TARTRATE 5 MG/5ML IV SOLN
5.0000 mg | INTRAVENOUS | Status: AC | PRN
  Administered 2018-09-08 – 2018-09-09 (×3): 5 mg via INTRAVENOUS
  Filled 2018-09-08 (×3): qty 5

## 2018-09-08 MED ORDER — ORAL CARE MOUTH RINSE
15.0000 mL | OROMUCOSAL | Status: DC
  Administered 2018-09-08 – 2018-09-13 (×54): 15 mL via OROMUCOSAL

## 2018-09-08 MED ORDER — PRO-STAT SUGAR FREE PO LIQD
30.0000 mL | Freq: Two times a day (BID) | ORAL | Status: DC
  Administered 2018-09-08 – 2018-09-13 (×10): 30 mL
  Filled 2018-09-08 (×10): qty 30

## 2018-09-08 MED ORDER — HEPARIN (PORCINE) 25000 UT/250ML-% IV SOLN
1500.0000 [IU]/h | INTRAVENOUS | Status: DC
  Administered 2018-09-08: 1600 [IU]/h via INTRAVENOUS
  Administered 2018-09-08 – 2018-09-09 (×2): 1400 [IU]/h via INTRAVENOUS
  Filled 2018-09-08 (×5): qty 250

## 2018-09-08 MED ORDER — ALBUMIN HUMAN 25 % IV SOLN
25.0000 g | Freq: Once | INTRAVENOUS | Status: AC
  Administered 2018-09-09: 25 g via INTRAVENOUS
  Filled 2018-09-08: qty 100

## 2018-09-08 MED ORDER — CHLORHEXIDINE GLUCONATE 0.12% ORAL RINSE (MEDLINE KIT)
15.0000 mL | Freq: Two times a day (BID) | OROMUCOSAL | Status: DC
  Administered 2018-09-08 – 2018-09-13 (×11): 15 mL via OROMUCOSAL

## 2018-09-08 NOTE — Progress Notes (Signed)
eLink Physician-Brief Progress Note Patient Name: [name] DOB: 05-16-1961 MRN: [id number]   Date of Service  09/08/2018  HPI/Events of Note  HR still elevated with digoxin.  eICU Interventions  Digoxin will take some time. Ordered metoprolol 5 mg IV     Intervention Category Major Interventions: Arrhythmia - evaluation and management  Rosalie Gumsmily T Loyda Costin 09/08/2018, 10:00 PM

## 2018-09-08 NOTE — Progress Notes (Addendum)
eLink Physician-Brief Progress Note Patient Name: Jose HerbRobert Voges DOB: 1961/06/29 MRN: [id number]   Date of Service  09/08/2018  HPI/Events of Note  Notified of afib RVR 150s. Patient seen intubated, sedated on Fentany. BP 119/68  eICU Interventions  Avoiding amiodarone given liver failure.  Will give a one time of Digoxin 0.25, will not load given renal insufficiency.     Intervention Category Major Interventions: Arrhythmia - evaluation and management  Darl Pikesmily T Moyses Pavey 09/08/2018, 8:23 PM

## 2018-09-08 NOTE — Progress Notes (Signed)
Initial Nutrition Assessment  DOCUMENTATION CODES:   Morbid obesity  INTERVENTION:   Tube Feeding:  Vital High Protein @ 65 ml/hr Pro-Stat 30 mL BID Provides 1760 kcals, 167 g of protein and 1310 mL of free water  Add MVI with minerals  NUTRITION DIAGNOSIS:   Inadequate oral intake related to acute illness as evidenced by NPO status.  GOAL:   Provide needs based on ASPEN/SCCM guidelines  MONITOR:   TF tolerance, Vent status, Labs, Weight trends, Skin  REASON FOR ASSESSMENT:   Ventilator    ASSESSMENT:   57 yo male admitted with acute respiratory failure [facility]. PMH includes alcoholic liver cirrhosis, chronic Hepatitis C, esophageal varices, Afib, PE, CHF, HTN, DM   12/13 [facility] with 5.5 L removed  Patient is currently intubated on ventilator support MV: 11.8 L/min Temp (24hrs), Avg:97 F (36.1 C), Min:94.3 F (34.6 C), Max:99.3 F (37.4 C)  Family, including patient's wife, at bedside; Family reports poor intake for 1 week (eating bites/sips) prior to [facility]. Pt eating some during that admission, including half a cheese burger and a few fries 24 hours prior to transfer [facility].   Family reports pt has experienced a significant amount of weight gain recently due to fluid gains "everywhere" including abdomen, buttock, legs, etc. Prior to this, pt had experienced weight loss of up to 40 pounds. Family reports it does appear that pt has lost weight in his face.   Nutrition focused physical exam deferred at this time per RN request as pt has been difficult to sedate  Labs: sodium 133, Creatinine 2.41, BUN 28, phosphorus 5.1 Meds: lactulose, folic acid, thiamine  NUTRITION - FOCUSED PHYSICAL EXAM:  Deferred until follow-up  Diet Order:   Diet Order            Diet NPO time specified  Diet effective now              EDUCATION NEEDS:   Not appropriate for  education at this time  Skin:  Skin Assessment: Reviewed RN Assessment  Last BM:  12/18  Height:   Ht Readings from Last 1 Encounters:  09/08/18 5\' 8"  (1.727 m)    Weight:   Wt Readings from Last 1 Encounters:  09/08/18 (!) 163 kg    Ideal Body Weight:  70 kg  BMI:  Body mass index is 54.64 kg/m.  Estimated Nutritional Needs:   Kcal:  1540-1750 kcals   Protein:  154-175 g  Fluid:  >/= 1.5 L   Romelle Starcherate Karie Skowron MS, RD, LDN, CNSC [phone] Pager  [phone] Weekend/On-Call Pager

## 2018-09-08 NOTE — Progress Notes (Signed)
ANTICOAGULATION CONSULT NOTE  Pharmacy Consult for Heparin Indication: Atrial fibrillation, history of PE  Patient Measurements: 161.5 kg per RN  Vital Signs: Temp: 98.1 F (36.7 C) (12/18 1119) Temp Source: Core (12/18 0800) BP: 105/61 (12/18 1119) Pulse Rate: 135 (12/18 1119)  Labs: Recent Labs    01-19-2018 2216 09/08/18 0524 09/08/18 1025  HGB 10.6* 10.1*  --   HCT 32.2* 31.4*  --   PLT 103* 101*  --   APTT 39*  --  57*  LABPROT 22.5*  --   --   INR 2.01  --   --   HEPARINUNFRC  --   --  1.84*  CREATININE 2.64* 2.41*  --   TROPONINI <0.03 0.03* <0.03     Assessment: 57 [facility] with likely worsening hepatic encephalopathy. He was on Apixaban PTA with questionable compliance. He was getting [facility] at [facility] with last dose 12/16 at 2038. Initial aPTT level is subtherapeutic. HL is high as expected from Eliquis.   Goal of Therapy:  Heparin level 0.3-0.7 units/ml aPTT 66-102 seconds Monitor platelets by anticoagulation protocol: Yes    Plan:  -Increase heparin gtt to 1600 units/hr -Daily HL, aPTT, CBC -Check aPTT in 6 hours   Dillyn Joaquin, Darl HouseholderAlison M 09/08/2018,1:34 PM

## 2018-09-08 NOTE — Progress Notes (Addendum)
..   NAME:  Jose Norris, MRN:  [id number], DOB:  03-Feb-1961, LOS: 1 ADMISSION DATE:  08/26/2018, CONSULTATION DATE:  09/05/2018 REFERRING MD:  DANFORD,CHRISTOPHER MD, CHIEF COMPLAINT:  RESP DISTRESS   Brief History   57 yr old M presenting from [place]  Was admitted there on 09/03/18. Was being managed for progressive shortness of breath. Pt ran out of his fluid pills and borrowed someone else's but it was not effective. He was noted to have weight gain and palpitations for a week prior to presentation.   History of present illness   57 yr old M with PMHx Alcoholic liver cirrhosis, chronic Hep C, Esophageal varices, Afib, PE on Eliquis (per the chart not compliant with it for 3-4 weeks- per family patient had no health insurance and recently received Medicaid and was able to be seen at the hospital), diastolic CHF, HTN and DM presenting from [facility]. Was admitted there on 09/03/18 for progressive shortness of breath and found to be in fluid overload. He ran out of his diuretic and borrowed someone else's and it was not working.  Upon presentation there he was in Afib RVR and started on cardizem gtt. He also received a large volume paracentesis followed by albumn 25 g IV x 1.  While in patient he was improving and was in stepdown when he became agitated and combative - suspected ETOH withdrawal . He received Haldol and Ativan at that time and was started on CIWA protocol. Per their report patient's respiratory status prior to this was tenuous and became progressively worse requiring ntubation.   Past Medical History  Alcoholic Liver cirrhosis Hepatitis C- viral load increased in comparison to prior Esophageal varices- EGD 05/25/18 PE- previously on Eliquis LE DVTs Atrial fibrillation(paroxysmal) DM HTN Diastolic CHF BPH  Significant Hospital Events   Prior to transport acute hypoxic resp failure - endotracheally intubated  Consults:  None at this time  Procedures:    Paracentesis - 09/03/18 in [place] [facility] 60 cc for diagnostic evaluation followed by 5.5L and received one dose of 25g albumin at that time.  Significant Diagnostic Tests:  CXR: Endotracheal tube tip just below the level of the clavicles. Enteric tube side port projects over the stomach. Small left pleural effusion and left basilar consolidation.  Micro Data:  Pending Paracentesis results from [place]- per Rn cx was negative. Cell count ( mod WBC) no numbers per RN  - blood cx x 2 - 12/17>> - tracheal cx - 12/17>>  Antimicrobials:  - paper record does not indicate any previous abx- confirmed no anx at [place] with RN there   - Ceftriaxone: 09/21/2018   Objective   Blood pressure 97/68, pulse (!) 112, temperature 99.3 F (37.4 C), resp. rate (!) 22, height 5\' 8"  (1.727 m), weight (!) 163 kg, SpO2 98 %. CVP:  [30 mmHg-34 mmHg] 30 mmHg  Vent Mode: PCV FiO2 (%):  [60 %] 60 % Set Rate:  [20 bmp-24 bmp] 20 bmp PEEP:  [5 cmH20] 5 cmH20 Plateau Pressure:  [24 cmH20-25 cmH20] 24 cmH20   Intake/Output Summary (Last 24 hours) at 09/08/2018 0916 Last data filed at 09/08/2018 0600 Gross per 24 hour  Intake 410.13 ml  Output 150 ml  Net 260.13 ml   Filed Weights   09/08/18 0000 09/08/18 0447  Weight: (!) 161.5 kg (!) 163 kg    Examination: General: Obese male who is heavily sedated on mechanical ventilatory support HEENT: Endotracheal tube is in place Neuro: Heavily sedated but grimaces and withdraws  to noxious stimuli CV: Sounds are regular irregular PULM: Decreased throughout GI: Distended, faint bowel sounds, fluid wave is appreciated.  Dressing in place from paracentesis Extremities: warm/dry, 3+  edema  Skin: Warm and dry      Assessment & Plan:  Acute hypoxic respiratory failure: -Vent bundle -Wean when stable -Underlying COPD will make this difficult -Underlying body habitus will create issues with extubation   Hypotensive on presentation- ? Septic shock -Evaluate  left ventricular function -Hold diuretics -Pressor support as needed  Liver cirrhosis (combination of alcohol induced injury and hep C) - on CT compared to Aug 2019 unchanged nodular hepatic contour noted w- MELD 23 (19.6% 3 month mortality)  - Hepatic encephalopathy  last Ammonia prior to transfer was 89 will start on Lactulose Enema BID. -Lactulose    Hepatitis C h/o with increased viral load- I -We will need outpatient follow-up   CIWA protocol for alcoholism Continue current treatment  Macrocytic anemia -Monitor hemoglobin -Thiamine folic acid Mod to large ascites with mesenteric edema and received paracentesis @ [facility]- they sent cytology, cell count, gram stain and cultures. 5.5 L removed and pt received Albumin post -continue to monitor -No need for repeat paracentesis at this time  H/o esophageal varices  [facility] No current evidence of GI bleed Proton pump inhibitor Start TF 09/08/17   Hypoosmolar Hyponatremia: h/o beer drinking. Na had improved during admission to [place].  -Monitor sodium currently 134  Thrombocytopenia  -Continue to monitor  Atrial Fibrillation (paroxysmal) -We will attempt to start low-dose Cardizem as needed  - CHADS2VASC- 4 ( HTN, diastolic CHF h/o PE and Diabetes in h/o) -Currently on anticoagulation with heparin -Currently in atrial fibrillation note he was on Cardizem as an outpatient as ventricular rate increases and if hemodynamic stable will start back on Cardizem   Acute kidney Injury Monitor creatinine Avoid nephrotoxic No need for renal consult at this time    Abdomen ?cellulitis -Day 1 of ceftriaxone -Culture data is pending -Monitor ventral hernia -No skin breakdown bilateral thighs lower sacrum    Best practice:  Diet: NPO Pain/Anxiety/Delirium protocol (if indicated): Fentanyl continuous and Versed  VAP protocol (if indicated): yes DVT prophylaxis: heparin gtt no  bolus GI prophylaxis: PPI Glucose control: yes ISS Phase 1 protocol Mobility: bedrest Code Status: FULL Family Communication: discussed with sister at bedside Disposition:   Labs   CBC: Recent Labs  Lab 09/11/2018 2216 09/08/18 0524  WBC 5.9 6.7  NEUTROABS 4.1  --   HGB 10.6* 10.1*  HCT 32.2* 31.4*  MCV 111.8* 110.6*  PLT 103* 101*    Basic Metabolic Panel: Recent Labs  Lab 09/06/2018 2216 09/08/18 0524  NA 134* 133*  K 4.4 4.2  CL 96* 96*  CO2 25 26  GLUCOSE 121* 110*  BUN 26* 28*  CREATININE 2.64* 2.41*  CALCIUM 8.4* 8.3*  MG 2.6* 2.5*  PHOS 5.1* 5.1*   GFR: Estimated Creatinine Clearance: 52 mL/min (A) (by C-G formula based on SCr of 2.41 mg/dL (H)). Recent Labs  Lab 09/21/2018 2216 08/24/2018 2235 09/08/18 0524  WBC 5.9  --  6.7  LATICACIDVEN  --  1.4 1.3    Liver Function Tests: Recent Labs  Lab 08/30/2018 2216  AST 32  ALT 21  ALKPHOS 77  BILITOT 3.8*  PROT 7.5  ALBUMIN 3.5   No results for input(s): LIPASE, AMYLASE in the last 168 hours. No results for input(s): AMMONIA in the last 168 hours.  ABG  Component Value Date/Time   PHART 7.433 09/08/2018 0325   PCO2ART 39.9 09/08/2018 0325   PO2ART 62.8 (L) 09/08/2018 0325   HCO3 26.5 09/08/2018 0325   O2SAT 92.3 09/08/2018 0325     Coagulation Profile: Recent Labs  Lab 09/10/2018 2216  INR 2.01    Cardiac Enzymes: Recent Labs  Lab Sep 10, 2018 2216 09/08/18 0524  TROPONINI <0.03 0.03*    HbA1C: Hgb A1c MFr Bld  Date/Time Value Ref Range Status  09/10/2018 10:35 PM 4.8 4.8 - 5.6 % Final    Comment:    (NOTE) Pre diabetes:          5.7%-6.4% Diabetes:              >6.4% Glycemic control for   <7.0% adults with diabetes     CBG: Recent Labs  Lab 09/10/18 2105 09/08/18 0010 09/08/18 0725  GLUCAP 121* 124* 102*   Critical care time: 30 mins    Steve Minor ACNP [facility] PCCM Pager [phone] till 1 pm If no answer page [phone] 09/08/2018, 9:17 AM  Attending  Note:  57 year old male transferred from [place] for SOB due to pulmonary edema.  On exam, diffuse crackles noted.  I reviewed CXR myself, ETT is in place with acute pulmonary edema noted.  Will continue with active diureses today.  Levophed for BP support.  Continue lactulose enema.  G-tube for TF given varices.  Correct hyponatremia.  F/U on ascites fluid.  PCCM will continue to follow.  The patient is critically ill with multiple organ systems failure and requires high complexity decision making for assessment and support, frequent evaluation and titration of therapies, application of advanced monitoring technologies and extensive interpretation of multiple databases.   Critical Care Time devoted to patient care services described in this note is  32  Minutes. This time reflects time of care of this signee Dr Koren Bound. This critical care time does not reflect procedure time, or teaching time or supervisory time of PA/NP/Med student/Med Resident etc but could involve care discussion time.  Alyson Reedy, M.D. [facility] Pulmonary/Critical Care Medicine. Pager: [phone]. After hours pager: [phone].

## 2018-09-08 NOTE — Progress Notes (Signed)
Arterial blood gas results given to Dr.Scatliffe. Vent changes made.

## 2018-09-08 NOTE — Progress Notes (Signed)
ANTICOAGULATION CONSULT NOTE  Pharmacy Consult for Heparin Indication: Atrial fibrillation, history of PE  Patient Measurements: Ht 68 in Wt 164 kg IBW: 68.4 kg Heparin dosing wt: 109 kg  Vital Signs: Temp: 98.1 F (36.7 C) (12/18 2000) Temp Source: Core (12/18 1600) BP: 119/68 (12/18 2000) Pulse Rate: 155 (12/18 2000)  Labs: Recent Labs    2018-03-22 2216 09/08/18 0524 09/08/18 1025 09/08/18 2027  HGB 10.6* 10.1*  --   --   HCT 32.2* 31.4*  --   --   PLT 103* 101*  --   --   APTT 39*  --  57* 121*  LABPROT 22.5*  --   --   --   INR 2.01  --   --   --   HEPARINUNFRC  --   --  1.84*  --   CREATININE 2.64* 2.41*  --   --   TROPONINI <0.03 0.03* <0.03  --      Assessment: 57 [facility] old male from [facility] with likely worsening hepatic encephalopathy. He was on Apixaban PTA with questionable compliance. He was getting [facility] at [facility] with last dose 12/16 at 2038. Baseline PTT 39 sec. Heparin level elevated as expected from apixaban.  PTT up to 121 sec (supratherapeutic) on 1600 units/hr. Heparin drawn from arm opposite where heparin infusing. No bleeding noted.  Goal of Therapy:  Heparin level 0.3-0.7 units/ml aPTT 66-102 seconds Monitor platelets by anticoagulation protocol: Yes  Plan:  -Decrease heparin gtt to 1400 units/hr -Daily HL, aPTT, CBC  Christoper Fabianaron Sedric Guia, PharmD, BCPS Clinical pharmacist  **Pharmacist phone directory can now be found on [email or web address] ([email or web address]).  Listed under [facility]. 09/08/2018,9:06 PM

## 2018-09-08 NOTE — Care Management Note (Signed)
Case Management Note Hortencia ConradiWendi Terrianne Cavness, RN MSN CCM Transitions of Care [place]M [phone]  Patient Details  Name: Jose Norris MRN: [id number] Date of Birth: [birth year]  Subjective/Objective:          Alcoholic cirrhosis-ETOH abuse          Action/Plan: PTA home with family. Insurance listed as Medicaid. [facility] where paracentesis was done-5.5 L and G-tube placed.  Currently intubated. Ammonia level increased-lactolose enema. IV fentanyl and heparin continuous. Will continue to follow for transition of care needs.   Expected Discharge Date:                  Expected Discharge Plan:  Home w Home Health Services  In-House Referral:     Discharge planning Services  CM Consult  Post Acute Care Choice:    Choice offered to:     DME Arranged:    DME Agency:     HH Arranged:    HH Agency:     Status of Service:  In process, will continue to follow  If discussed at Long Length of Stay Meetings, dates discussed:    Additional Comments:  Bess KindsWendi B Raynah Gomes, RN 09/08/2018, 3:17 PM

## 2018-09-09 ENCOUNTER — Inpatient Hospital Stay (HOSPITAL_COMMUNITY): Payer: Medicaid Other

## 2018-09-09 DIAGNOSIS — I361 Nonrheumatic tricuspid (valve) insufficiency: Secondary | ICD-10-CM

## 2018-09-09 LAB — URINE CULTURE: Culture: NO GROWTH

## 2018-09-09 LAB — POCT I-STAT 3, ART BLOOD GAS (G3+)
Acid-Base Excess: 5 mmol/L — ABNORMAL HIGH (ref 0.0–2.0)
Bicarbonate: 30.4 mmol/L — ABNORMAL HIGH (ref 20.0–28.0)
O2 Saturation: 99 %
Patient temperature: 98.1
TCO2: 32 mmol/L (ref 22–32)
pCO2 arterial: 47.2 mmHg (ref 32.0–48.0)
pH, Arterial: 7.415 (ref 7.350–7.450)
pO2, Arterial: 150 mmHg — ABNORMAL HIGH (ref 83.0–108.0)

## 2018-09-09 LAB — BASIC METABOLIC PANEL
Anion gap: 11 (ref 5–15)
Anion gap: 12 (ref 5–15)
BUN: 34 mg/dL — ABNORMAL HIGH (ref 6–20)
BUN: 34 mg/dL — ABNORMAL HIGH (ref 6–20)
CO2: 25 mmol/L (ref 22–32)
CO2: 23 mmol/L (ref 22–32)
Calcium: 8.4 mg/dL — ABNORMAL LOW (ref 8.9–10.3)
Calcium: 8.1 mg/dL — ABNORMAL LOW (ref 8.9–10.3)
Chloride: 102 mmol/L (ref 98–111)
Chloride: 103 mmol/L (ref 98–111)
Creatinine, Ser: 2.35 mg/dL — ABNORMAL HIGH (ref 0.61–1.24)
Creatinine, Ser: 2.08 mg/dL — ABNORMAL HIGH (ref 0.61–1.24)
GFR calc Af Amer: 35 mL/min — ABNORMAL LOW (ref >60)
GFR calc Af Amer: 40 mL/min — ABNORMAL LOW (ref >60)
GFR calc non Af Amer: 30 mL/min — ABNORMAL LOW (ref >60)
GFR calc non Af Amer: 35 mL/min — ABNORMAL LOW (ref >60)
GLUCOSE: 127 mg/dL — AB (ref 70–99)
Glucose, Bld: 118 mg/dL — ABNORMAL HIGH (ref 70–99)
Potassium: 3.5 mmol/L (ref 3.5–5.1)
Potassium: 3.5 mmol/L (ref 3.5–5.1)
Sodium: 138 mmol/L (ref 135–145)
Sodium: 138 mmol/L (ref 135–145)

## 2018-09-09 LAB — LACTATE DEHYDROGENASE, PLEURAL OR PERITONEAL FLUID: LD, Fluid: 62 U/L — ABNORMAL HIGH (ref 3–23)

## 2018-09-09 LAB — CBC
HEMATOCRIT: 34 % — AB (ref 39.0–52.0)
Hemoglobin: 10.9 g/dL — ABNORMAL LOW (ref 13.0–17.0)
MCH: 36 pg — ABNORMAL HIGH (ref 26.0–34.0)
MCHC: 32.1 g/dL (ref 30.0–36.0)
MCV: 112.2 fL — ABNORMAL HIGH (ref 80.0–100.0)
Platelets: 90 10*3/uL — ABNORMAL LOW (ref 150–400)
RBC: 3.03 MIL/uL — ABNORMAL LOW (ref 4.22–5.81)
RDW: 16.4 % — AB (ref 11.5–15.5)
WBC: 8.1 10*3/uL (ref 4.0–10.5)
nRBC: 0 % (ref 0.0–0.2)

## 2018-09-09 LAB — AMYLASE, PLEURAL OR PERITONEAL FLUID: Amylase, Fluid: 20 U/L

## 2018-09-09 LAB — PROTEIN, PLEURAL OR PERITONEAL FLUID: Total protein, fluid: <3 g/dL

## 2018-09-09 LAB — BODY FLUID CELL COUNT WITH DIFFERENTIAL
Lymphs, Fluid: 50 %
Monocyte-Macrophage-Serous Fluid: 33 % — ABNORMAL LOW (ref 50–90)
Neutrophil Count, Fluid: 17 % (ref 0–25)
Total Nucleated Cell Count, Fluid: 325 cu mm (ref 0–1000)

## 2018-09-09 LAB — ECHOCARDIOGRAM COMPLETE
Height: 70 in
Weight: 5502.68 oz

## 2018-09-09 LAB — GLUCOSE, CAPILLARY
GLUCOSE-CAPILLARY: 124 mg/dL — AB (ref 70–99)
GLUCOSE-CAPILLARY: 141 mg/dL — AB (ref 70–99)
Glucose-Capillary: 108 mg/dL — ABNORMAL HIGH (ref 70–99)
Glucose-Capillary: 119 mg/dL — ABNORMAL HIGH (ref 70–99)
Glucose-Capillary: 120 mg/dL — ABNORMAL HIGH (ref 70–99)
Glucose-Capillary: 120 mg/dL — ABNORMAL HIGH (ref 70–99)
Glucose-Capillary: 128 mg/dL — ABNORMAL HIGH (ref 70–99)

## 2018-09-09 LAB — APTT
aPTT: 86 seconds — ABNORMAL HIGH (ref 24–36)
aPTT: 85 seconds — ABNORMAL HIGH (ref 24–36)

## 2018-09-09 LAB — GLUCOSE, PLEURAL OR PERITONEAL FLUID: Glucose, Fluid: 142 mg/dL

## 2018-09-09 LAB — PHOSPHORUS: Phosphorus: 5.5 mg/dL — ABNORMAL HIGH (ref 2.5–4.6)

## 2018-09-09 LAB — ALBUMIN, PLEURAL OR PERITONEAL FLUID: Albumin, Fluid: <1 g/dL

## 2018-09-09 LAB — PROCALCITONIN: Procalcitonin: <0.1 ng/mL

## 2018-09-09 LAB — HEPARIN LEVEL (UNFRACTIONATED): Heparin Unfractionated: 1.24 IU/mL — ABNORMAL HIGH (ref 0.30–0.70)

## 2018-09-09 LAB — MAGNESIUM: Magnesium: 2.7 mg/dL — ABNORMAL HIGH (ref 1.7–2.4)

## 2018-09-09 LAB — AMMONIA: Ammonia: 36 umol/L — ABNORMAL HIGH (ref 9–35)

## 2018-09-09 MED ORDER — PHENYLEPHRINE HCL-NACL 10-0.9 MG/250ML-% IV SOLN
0.0000 ug/min | INTRAVENOUS | Status: DC
  Administered 2018-09-11: 20 ug/min via INTRAVENOUS
  Filled 2018-09-09 (×7): qty 250

## 2018-09-09 MED ORDER — PERFLUTREN LIPID MICROSPHERE
1.0000 mL | INTRAVENOUS | Status: AC | PRN
  Filled 2018-09-09: qty 10

## 2018-09-09 MED ORDER — METOPROLOL TARTRATE 5 MG/5ML IV SOLN
5.0000 mg | INTRAVENOUS | Status: DC | PRN
  Administered 2018-09-09 – 2018-09-10 (×5): 5 mg via INTRAVENOUS
  Filled 2018-09-09 (×5): qty 5

## 2018-09-09 NOTE — Progress Notes (Signed)
ANTICOAGULATION CONSULT NOTE  Pharmacy Consult for Heparin Indication: Atrial fibrillation, history of PE  Patient Measurements: Ht 68 in Wt 164 kg IBW: 68.4 kg Heparin dosing wt: 109 kg  Vital Signs: Temp: 98.2 F (36.8 C) (12/19 1300) Temp Source: Bladder (12/19 0800) BP: 96/74 (12/19 1300) Pulse Rate: 133 (12/19 1337)  Labs: Recent Labs    24-Sep-2017 2216 09/08/18 0524 09/08/18 1025 09/08/18 2027 09/09/18 0444 09/09/18 1326  HGB 10.6* 10.1*  --   --   --   --   HCT 32.2* 31.4*  --   --   --   --   PLT 103* 101*  --   --   --   --   APTT 39*  --  57* 121* 86* 85*  LABPROT 22.5*  --   --   --   --   --   INR 2.01  --   --   --   --   --   HEPARINUNFRC  --   --  1.84*  --  1.24*  --   CREATININE 2.64* 2.41*  --   --  2.35* 2.08*  TROPONINI <0.03 0.03* <0.03  --   --   --      Assessment: 57 year old male from [facility] with likely worsening hepatic encephalopathy. He was on Apixaban PTA with questionable compliance. [facility] with last dose 12/16 at 2038. Baseline PTT 39 sec. Heparin level elevated as expected from apixaban.  PTT at 85 sec (therapeutic) on 1400 units/hr. No bleeding noted.  Goal of Therapy:  Heparin level 0.3-0.7 units/ml aPTT 66-102 seconds Monitor platelets by anticoagulation protocol: Yes  Plan:  -Continue heparin gtt at 1400 units/hr -Daily HL, aPTT, CBC  Jeanella Caraathy Omauri Boeve, PharmD, [facility] Clinical Pharmacist Please see AMION for all Pharmacists' Contact Phone Numbers 09/09/2018, 2:28 PM

## 2018-09-09 NOTE — Procedures (Signed)
Arterial Catheter Insertion Procedure Note Jose Norris [id number] 06-11-1961  Procedure: Insertion of Arterial Catheter  Indications: Blood pressure monitoring  Procedure Details Consent: Risks of procedure as well as the alternatives and risks of each were explained to the (patient/caregiver).  Consent for procedure obtained. and Unable to obtain consent because of emergent medical necessity. Time Out: Verified patient identification, verified procedure, site/side was marked, verified correct patient position, special equipment/implants available, medications/allergies/relevent history reviewed, required imaging and test results available.  Performed  Maximum sterile technique was used including antiseptics, cap, gloves, gown, hand hygiene and mask. Skin prep: Chlorhexidine; local anesthetic administered 20 gauge catheter was inserted into left radial artery using the Seldinger technique. ULTRASOUND GUIDANCE USED: YES Evaluation Blood flow good; BP tracing good. Complications: No apparent complications.   Jose Norris, Jose Norris 09/09/2018

## 2018-09-09 NOTE — Progress Notes (Signed)
Unable to perform echo due to elevated HR (140 bpm +)

## 2018-09-09 NOTE — Progress Notes (Signed)
  Echocardiogram 2D Echocardiogram has been performed.  Janalyn HarderWest, Allyse Fregeau R 09/09/2018, 2:28 PM

## 2018-09-09 NOTE — Progress Notes (Signed)
ANTICOAGULATION CONSULT NOTE - Follow Up Consult  Pharmacy Consult for heparin Indication: Afib and h/o PE  Labs: Recent Labs    February 28, 2018 2216 09/08/18 0524 09/08/18 1025 09/08/18 2027 09/09/18 0444  HGB 10.6* 10.1*  --   --   --   HCT 32.2* 31.4*  --   --   --   PLT 103* 101*  --   --   --   APTT 39*  --  57* 121* 86*  LABPROT 22.5*  --   --   --   --   INR 2.01  --   --   --   --   HEPARINUNFRC  --   --  1.84*  --  1.24*  CREATININE 2.64* 2.41*  --   --  2.35*  TROPONINI <0.03 0.03* <0.03  --   --     Assessment/Plan:  57yo male therapeutic on heparin after rate change. Will continue gtt at current rate and confirm stable with additional PTT.   Vernard GamblesVeronda Shaquanta Harkless, PharmD, BCPS  09/09/2018,6:04 AM

## 2018-09-09 NOTE — Procedures (Signed)
Paracentesis Procedure Note  Pre-operative Diagnosis: Ascites secondary to liver Cirrhosis from Alcoholism and Chronic Hepatitis C  Post-operative Diagnosis: same, Improved Ascites secondary to liver cirrhosis from Alcoholism and Chronic Hepatitis C  Indications:  Therapeutic increasing abdominal distension making difficult to ventilate and oxygenate patient (worsening ABG).  Diagnostic: To r/o infection and guide further mangaement  Procedure Details  Consent: Informed consent was obtained. Risks of the procedure were discussed including: infection, bleeding, and pain.  Under sterile conditions the patient was laid flat. 2% Chlorhexidine solution and sterile drapes were utilized.  Maximal barrier precaution including gloves, mask, cap, gown and drape were utilized. Ultrasound was used to evaluate Left and Right lower quadrant of the abdomen prior to procedure. Large anechoic space noted with no visible bowel at RLQ.   Under US guidance 1% plain lidocaine was used to anesthetize right lower quadrant space. A small incision was made with scalpel blade and then cathter over needle was slowly introduced under negative pressure. Once fluid was obtained catheter was advanced and needle was withdrawn.  Fluid was obtained without any difficulties and minimal blood loss.  A dressing was applied to the wound and wound care instructions were provided.   Findings 5000 ml of cloudy serosanguinous peritoneal fluid was obtained. Samples are to be sent to Pathology for cytogenetics, flow, and cell counts, as well as for infection analysis.  Complications:  None; patient tolerated the procedure well.   Pt received 25% Albumin x 2 post procedure due to large volume paracentesis.         Condition: stable  Plan Pt continues on bed rest intubated with improved volumes Hemodynamically stable Repeat ABG has improved significantly  Attending Attestation: I performed the procedure.  Signed Dr  Newell CoralKristen Tanesia Butner Pulmonary Critical Care Locums

## 2018-09-09 NOTE — Progress Notes (Addendum)
..   NAME:  Jose Norris, MRN:  [id number], DOB:  05-26-1961, LOS: 2 ADMISSION DATE:  25-Sep-2018, CONSULTATION DATE:  09/25/2018 REFERRING MD:  DANFORD,CHRISTOPHER MD, CHIEF COMPLAINT:  RESP DISTRESS   Brief History   57 yr old M presenting from [place]  Was admitted there on 09/03/18. Was being managed for progressive shortness of breath, noncompliant as outpatient due to bad insurance.  Pt ran out of his fluid pills and borrowed someone else's but it was not effective. He was noted to have weight gain and palpitations for a week prior to presentation.   Transferred to the ICU for suspected EtOH withdrawal, respiratory failure requiring intubation.  Past Medical History  Alcoholic Liver cirrhosis Hepatitis C- viral load increased in comparison to prior Esophageal varices- EGD 05/25/18 PE- previously on Eliquis LE DVTs Atrial fibrillation(paroxysmal) DM HTN Diastolic CHF BPH  Significant Hospital Events   Prior to transport acute hypoxic resp failure - endotracheally intubated  Consults:  None at this time  Procedures:  OETT 12/17 > Lt IJ 12/17 >   Paracentesis - 12/13 in [place] ER 60 cc for diagnostic evaluation followed by 5.5L and received one dose of 25g albumin at that time.  Paracentesis 12/18 > 5 L removed. Albumin x 2 given  Significant Diagnostic Tests:  Chest x-ray 09/09/2018- ET tube in adequate position, bibasal atelectasis. I have reviewed the images personally.  Micro Data:  Pending Paracentesis results from [place]- per Rn cx was negative. Cell count ( mod WBC) no numbers per RN  - blood cx x 2 - 12/17>> - tracheal cx - 12/17>> 09/08/2018 urine culture negative 02/07/2018 paracentesis fluid culture>>  Antimicrobials:  - paper record does not indicate any previous abx- confirmed no anx at [facility] with RN there   - Ceftriaxone: 25-Sep-2018   Objective   Blood pressure (!) 107/53, pulse (!) 144, temperature 97.9 F (36.6 C), temperature source  Bladder, resp. rate 20, height 5\' 10"  (1.778 m), weight (!) 156 kg, SpO2 97 %. CVP:  [23 mmHg-30 mmHg] 24 mmHg  Vent Mode: PRVC FiO2 (%):  [60 %] 60 % Set Rate:  [20 bmp] 20 bmp Vt Set:  [580 mL] 580 mL PEEP:  [5 cmH20] 5 cmH20 Plateau Pressure:  [19 cmH20-32 cmH20] 22 cmH20   Intake/Output Summary (Last 24 hours) at 09/09/2018 1111 Last data filed at 09/09/2018 0600 Gross per 24 hour  Intake 1565.45 ml  Output 432 ml  Net 1133.45 ml   Filed Weights   09/08/18 0000 09/08/18 0447 09/09/18 0454  Weight: (!) 161.5 kg (!) 163 kg (!) 156 kg    Examination: General: Morbidly obese male who is either sedated or agitated HEENT: Endotracheal tube in place Neuro: Does not follow commands either agitated or sedated CV: Heart sounds are irregular with atrial fibrillation controlled ventricular rate PULM: even/non-labored, lungs bilaterally diminished in the bases GI: Obese, decreased size secondary to recent paracentesis, positive bowel Extremities: warm/dry, 2+ edema  Skin: no rashes or lesions   Assessment & Plan:  Acute hypoxic respiratory failure: -Vent bundle -Currently weanable -He has underlying COPD which will impede weaning -Morbid obesity will also impede weaning   Hypotensive on presentation- ? Septic shock -Currently off pressor support -CVP noted to be 23   Liver cirrhosis (combination of alcohol induced injury and hep C) - on CT compared to Aug 2019 unchanged nodular hepatic contour noted w- MELD 23 (19.6% 3 month mortality)  - Hepatic encephalopathy  last Ammonia prior to transfer was 89 will  start on Lactulose Enema BID.  09/09/2018 ammonia level 39 -Lactulose    Hepatitis C h/o with increased viral load- I -Outpatient follow-up  CIWA protocol for alcoholism Continue current treatment  Macrocytic anemia -Monitor hemoglobin -Thiamine folic acid  Mod to large ascites with mesenteric edema and received paracentesis @ [place] ED- they sent cytology,  cell count, gram stain and cultures. 5.5 L removed and pt received Albumin post Repeat paracentesis 09/09/2018 with 5 L  -continue to monitor -No need for repeat paracentesis at this time  H/o esophageal varices  Continue with gastric tube No further evidence of GI bleeding PPI Tube feeding    Hypoosmolar Hyponatremia: h/o beer drinking. Na had improved during admission to [facility].  -Monitor sodium currently 134  Thrombocytopenia  -Monitor  Atrial Fibrillation (paroxysmal) -Low-dose beta-blocker -Switch Levophed to Neo-Synephrine as needed  - CHADS2VASC- 4 ( HTN, diastolic CHF h/o PE and Diabetes in h/o), shock, 09/09/2018 currently off vasopressor support.  09/09/2018 CVP is 23 -Anticoagulation with heparin -Vasopressor support as needed we will DC Levophed and add low-dose Neo-Synephrine as needed -   Acute kidney Injury creatinine stable at 2.35 Monitor renal function     Abdomen ?cellulitis -Day 2 of ceftriaxone -Monitor culture data     Best practice:  Diet: NPO Pain/Anxiety/Delirium protocol (if indicated): Fentanyl continuous and Versed  VAP protocol (if indicated): yes DVT prophylaxis: heparin gtt no bolus GI prophylaxis: PPI Glucose control: yes ISS Phase 1 protocol Mobility: bedrest Code Status: FULL Family Communication: 09/09/2018 no family at bedside Disposition:   Labs   CBC: Recent Labs  Lab 08/24/2018 2216 09/08/18 0524  WBC 5.9 6.7  NEUTROABS 4.1  --   HGB 10.6* 10.1*  HCT 32.2* 31.4*  MCV 111.8* 110.6*  PLT 103* 101*    Basic Metabolic Panel: Recent Labs  Lab 08/27/2018 2216 09/08/18 0524 09/08/18 1629 09/08/18 2027 09/09/18 0444  NA 134* 133*  --   --  138  K 4.4 4.2  --   --  3.5  CL 96* 96*  --   --  102  CO2 25 26  --   --  25  GLUCOSE 121* 110*  --   --  127*  BUN 26* 28*  --   --  34*  CREATININE 2.64* 2.41*  --   --  2.35*  CALCIUM 8.4* 8.3*  --   --  8.4*  MG 2.6* 2.5* 2.6* 2.7* 2.7*  PHOS 5.1* 5.1* 5.6* 6.3*  5.5*   GFR: Estimated Creatinine Clearance: 53.4 mL/min (A) (by C-G formula based on SCr of 2.35 mg/dL (H)). Recent Labs  Lab 09/06/2018 2216 09/01/2018 2235 09/08/18 0524  WBC 5.9  --  6.7  LATICACIDVEN  --  1.4 1.3    Liver Function Tests: Recent Labs  Lab 09/19/2018 2216 09/08/18 1025  AST 32 32  ALT 21 19  ALKPHOS 77 68  BILITOT 3.8* 3.2*  PROT 7.5 7.3  ALBUMIN 3.5 3.1*   No results for input(s): LIPASE, AMYLASE in the last 168 hours. Recent Labs  Lab 09/08/18 0942 09/09/18 0444  AMMONIA 34 36*    ABG    Component Value Date/Time   PHART 7.415 09/09/2018 0113   PCO2ART 47.2 09/09/2018 0113   PO2ART 150.0 (H) 09/09/2018 0113   HCO3 30.4 (H) 09/09/2018 0113   TCO2 32 09/09/2018 0113   ACIDBASEDEF 1.0 09/08/2018 2306   O2SAT 99.0 09/09/2018 0113     Coagulation Profile: Recent Labs  Lab 08/23/2018 2216  INR 2.01    Cardiac Enzymes: Recent Labs  Lab 08/27/2018 2216 09/08/18 0524 09/08/18 1025  TROPONINI <0.03 0.03* <0.03    HbA1C: Hgb A1c MFr Bld  Date/Time Value Ref Range Status  09/19/2018 10:35 PM 4.8 4.8 - 5.6 % Final    Comment:    (NOTE) Pre diabetes:          5.7%-6.4% Diabetes:              >6.4% Glycemic control for   <7.0% adults with diabetes     CBG: Recent Labs  Lab 09/08/18 0010 09/08/18 0725 09/09/18 0040 09/09/18 0422 09/09/18 0738  GLUCAP 124* 102* 141* 124* 128*   Critical care time: 30 mins    Brett CanalesSteve Minor ACNP [facility] Pager [phone] till 1 pm If no answer page [phone] 09/09/2018, 11:11 AM  Attending note: I have seen and examined the patient. History, labs and imaging reviewed.  Underwent paracentesis yesterday. Rapid A. fib.  Given 1 dose of metoprolol, digoxin  Blood pressure (!) 81/63, pulse 64, temperature 98.1 F (36.7 C), resp. rate 20, height 5\' 10"  (1.778 m), weight (!) 156 kg, SpO2 99 %. Gen:      No acute distress obese HEENT:  EOMI, sclera anicteric, ET tube Neck:     No masses; no  thyromegaly Lungs:    Clear to auscultation bilaterally; normal respiratory effort CV:         Regular rate and rhythm; no murmurs Abd:      + bowel sounds; soft, non-tender; no palpable masses, no distension Ext:    2+ edema; adequate peripheral perfusion Skin:      Warm and dry; no rash Neuro: Sedated, unresponsive  Labs reviewed, significant for Sodium 138, potassium 3.5, BUN/creatinine 34/2.35 WBC 6.7, hemoglobin 10.1, platelets 101  Imaging Chest x-ray 09/01/2018-ET tube impression, bibasilar infiltrates.  I have reviewed the images personally.  Assessment/Plan: 57 year old with decompensated liver failure in setting of EtOH cirrhosis, respiratory failure, alcohol withdrawal  Resp failure Continue full vent support Follow-up chest x-ray  Cirrhosis, portal pulmonary hypertension EtOH abuse, hepatitis C Underwent paracentesis Follow cultures Continue ceftriaxone  Atrial fibrillation Follow echocardiogram Stop levo, start Neo-Synephrine and start May need amiodarone if his heart rate is sustained.  The patient is critically ill with multiple organ systems failure and requires high complexity decision making for assessment and support, frequent evaluation and titration of therapies, application of advanced monitoring technologies and extensive interpretation of multiple databases.  Critical care time - 35 mins. This represents my time independent of the NPs time taking care of the pt.  Chilton GreathousePraveen Kierria Feigenbaum MD [facility] 09/09/2018, 1:02 PM

## 2018-09-10 ENCOUNTER — Inpatient Hospital Stay (HOSPITAL_COMMUNITY): Payer: Medicaid Other

## 2018-09-10 LAB — GLUCOSE, CAPILLARY
Glucose-Capillary: 127 mg/dL — ABNORMAL HIGH (ref 70–99)
Glucose-Capillary: 127 mg/dL — ABNORMAL HIGH (ref 70–99)
Glucose-Capillary: 143 mg/dL — ABNORMAL HIGH (ref 70–99)
Glucose-Capillary: 151 mg/dL — ABNORMAL HIGH (ref 70–99)
Glucose-Capillary: 130 mg/dL — ABNORMAL HIGH (ref 70–99)

## 2018-09-10 LAB — PHOSPHORUS
PHOSPHORUS: 4.4 mg/dL (ref 2.5–4.6)
PHOSPHORUS: 4.2 mg/dL (ref 2.5–4.6)

## 2018-09-10 LAB — CBC
HCT: 37.4 % — ABNORMAL LOW (ref 39.0–52.0)
Hemoglobin: 11.8 g/dL — ABNORMAL LOW (ref 13.0–17.0)
MCH: 35.1 pg — ABNORMAL HIGH (ref 26.0–34.0)
MCHC: 31.6 g/dL (ref 30.0–36.0)
MCV: 111.3 fL — ABNORMAL HIGH (ref 80.0–100.0)
Platelets: 103 10*3/uL — ABNORMAL LOW (ref 150–400)
RBC: 3.36 MIL/uL — AB (ref 4.22–5.81)
RDW: 16.2 % — ABNORMAL HIGH (ref 11.5–15.5)
WBC: 9 10*3/uL (ref 4.0–10.5)
nRBC: 0 % (ref 0.0–0.2)

## 2018-09-10 LAB — APTT
aPTT: 66 seconds — ABNORMAL HIGH (ref 24–36)
aPTT: 75 seconds — ABNORMAL HIGH (ref 24–36)

## 2018-09-10 LAB — BASIC METABOLIC PANEL
Anion gap: 10 (ref 5–15)
BUN: 35 mg/dL — ABNORMAL HIGH (ref 6–20)
CHLORIDE: 104 mmol/L (ref 98–111)
CO2: 24 mmol/L (ref 22–32)
Calcium: 8.3 mg/dL — ABNORMAL LOW (ref 8.9–10.3)
Creatinine, Ser: 1.77 mg/dL — ABNORMAL HIGH (ref 0.61–1.24)
GFR calc Af Amer: 49 mL/min — ABNORMAL LOW (ref >60)
GFR calc non Af Amer: 42 mL/min — ABNORMAL LOW (ref >60)
Glucose, Bld: 139 mg/dL — ABNORMAL HIGH (ref 70–99)
POTASSIUM: 3.8 mmol/L (ref 3.5–5.1)
Sodium: 138 mmol/L (ref 135–145)

## 2018-09-10 LAB — POCT I-STAT 3, ART BLOOD GAS (G3+)
Bicarbonate: 25.2 mmol/L (ref 20.0–28.0)
O2 Saturation: 99 %
TCO2: 27 mmol/L (ref 22–32)
pCO2 arterial: 45 mmHg (ref 32.0–48.0)
pH, Arterial: 7.362 (ref 7.350–7.450)
pO2, Arterial: 131 mmHg — ABNORMAL HIGH (ref 83.0–108.0)

## 2018-09-10 LAB — TRIGLYCERIDES, BODY FLUIDS: Triglycerides, Fluid: 106 mg/dL

## 2018-09-10 LAB — PROTIME-INR
INR: 1.71
Prothrombin Time: 19.9 seconds — ABNORMAL HIGH (ref 11.4–15.2)

## 2018-09-10 LAB — MAGNESIUM
Magnesium: 2.6 mg/dL — ABNORMAL HIGH (ref 1.7–2.4)
Magnesium: 2.6 mg/dL — ABNORMAL HIGH (ref 1.7–2.4)

## 2018-09-10 LAB — PROCALCITONIN: Procalcitonin: 0.12 ng/mL

## 2018-09-10 MED ORDER — IPRATROPIUM-ALBUTEROL 0.5-2.5 (3) MG/3ML IN SOLN: 3.0000 mL | Freq: Four times a day (QID) | RESPIRATORY_TRACT | Status: DC | PRN

## 2018-09-10 MED ORDER — SPIRONOLACTONE 50 MG PO TABS
50.0000 mg | ORAL_TABLET | Freq: Every day | ORAL | Status: DC
  Administered 2018-09-10 – 2018-09-13 (×4): 50 mg via ORAL
  Filled 2018-09-10 (×4): qty 1

## 2018-09-10 MED ORDER — FUROSEMIDE 10 MG/ML IJ SOLN
40.0000 mg | Freq: Once | INTRAMUSCULAR | Status: AC
  Administered 2018-09-10: 40 mg via INTRAVENOUS
  Filled 2018-09-10: qty 4

## 2018-09-10 MED ORDER — DILTIAZEM HCL-DEXTROSE 100-5 MG/100ML-% IV SOLN (PREMIX)
5.0000 mg/h | INTRAVENOUS | Status: DC
  Administered 2018-09-10: 5 mg/h via INTRAVENOUS
  Administered 2018-09-10 – 2018-09-11 (×2): 15 mg/h via INTRAVENOUS
  Administered 2018-09-11: 10 mg/h via INTRAVENOUS
  Administered 2018-09-12: 15 mg/h via INTRAVENOUS
  Administered 2018-09-12: 5 mg/h via INTRAVENOUS
  Administered 2018-09-13: 15 mg/h via INTRAVENOUS
  Administered 2018-09-13: 12.5 mg/h via INTRAVENOUS
  Filled 2018-09-10 (×8): qty 100

## 2018-09-10 MED ORDER — FUROSEMIDE 10 MG/ML IJ SOLN: 40.0000 mg | Freq: Two times a day (BID) | INTRAMUSCULAR | Status: DC

## 2018-09-10 MED ORDER — FUROSEMIDE 10 MG/ML IJ SOLN
40.0000 mg | Freq: Once | INTRAMUSCULAR | Status: AC
  Administered 2018-09-11: 40 mg via INTRAVENOUS
  Filled 2018-09-10: qty 4

## 2018-09-10 MED ORDER — DILTIAZEM LOAD VIA INFUSION
10.0000 mg | Freq: Once | INTRAVENOUS | Status: AC
  Administered 2018-09-10: 10 mg via INTRAVENOUS
  Filled 2018-09-10: qty 10

## 2018-09-10 NOTE — Progress Notes (Signed)
ANTICOAGULATION CONSULT NOTE  Pharmacy Consult for Heparin Indication: Atrial fibrillation, history of PE  Patient Measurements: Ht 68 in Wt 164 kg IBW: 68.4 kg Heparin dosing wt: 109 kg  Vital Signs: Temp: 100 F (37.8 C) (12/20 1300) Temp Source: Bladder (12/20 0800) BP: 105/76 (12/20 1300) Pulse Rate: 114 (12/20 1300)  Labs: Recent Labs    Nov 26, 2017 2216 09/08/18 0524 09/08/18 1025  09/09/18 0444 09/09/18 1326 09/10/18 0409 09/10/18 1451  HGB 10.6* 10.1*  --   --   --  10.9* 11.8*  --   HCT 32.2* 31.4*  --   --   --  34.0* 37.4*  --   PLT 103* 101*  --   --   --  90* 103*  --   APTT 39*  --  57*   < > 86* 85* 66* 75*  LABPROT 22.5*  --   --   --   --   --  19.9*  --   INR 2.01  --   --   --   --   --  1.71  --   HEPARINUNFRC  --   --  1.84*  --  1.24*  --   --   --   CREATININE 2.64* 2.41*  --   --  2.35* 2.08* 1.77*  --   TROPONINI <0.03 0.03* <0.03  --   --   --   --   --    < > = values in this interval not displayed.     Assessment: 57 [facility] with likely worsening hepatic encephalopathy. He was on Apixaban PTA with questionable compliance. [facility] with last dose 12/16 at 2038. APTT is therapeutic on the low end of reference range this morning. CBC wnl.  PTT therapeutic this evening  Goal of Therapy:  Heparin level 0.3-0.7 units/ml aPTT 66-102 seconds Monitor platelets by anticoagulation protocol: Yes  Plan:  -Continue  heparin gtt at 1500 units/hr -Daily HL, aPTT, CBC  Elwin Sleightowell, Zabdi Mis Kay 09/10/2018 3:25 PM

## 2018-09-10 NOTE — Care Management Note (Signed)
Case Management Note Hortencia ConradiWendi Tollie Canada, RN MSN CCM Transitions of Care [place] [phone]  Patient Details  Name: Jose Norris MRN: [id number] Date of Birth: 1960-09-27  Subjective/Objective:          Alcoholic cirrhosis-ETOH abuse          Action/Plan: PTA home with family. Insurance listed as Medicaid. [facility]esis was done-5.5 L and G-tube placed.  Currently intubated. Ammonia level increased-lactolose enema. IV fentanyl and heparin continuous. Will continue to follow for transition of care needs.   Expected Discharge Date:                  Expected Discharge Plan:  Home w Home Health Services  In-House Referral:     Discharge planning Services  CM Consult  Post Acute Care Choice:    Choice offered to:     DME Arranged:    DME Agency:     HH Arranged:    HH Agency:     Status of Service:  In process, will continue to follow  If discussed at Long Length of Stay Meetings, dates discussed:    Additional Comments:  Bess KindsWendi B Callyn Severtson, RN 09/10/2018, 4:21 PM Case Management Note  Patient Details  Name: Jose Norris MRN: [id number] Date of Birth: 1960-09-27  Subjective/Objective:                    Action/Plan:   Expected Discharge Date:                  Expected Discharge Plan:  Home w Home Health Services  In-House Referral:     Discharge planning Services  CM Consult  Post Acute Care Choice:    Choice offered to:     DME Arranged:    DME Agency:     HH Arranged:    HH Agency:     Status of Service:  In process, will continue to follow  If discussed at Long Length of Stay Meetings, dates discussed:    Additional Comments: 09/10/2018 1622 Hortencia ConradiWendi Treyten Monestime, RN MSN CCM Pt remains intubated. 2nd paracentesis for 5 L. Ammonia level trending down-continues with lactulose enemas. IV cardizem and metoprolol for afib rvr. Will continue to follow for transition of care needs.   Bess KindsWendi B Porshe Fleagle, RN 09/10/2018, 4:21 PM

## 2018-09-10 NOTE — Progress Notes (Signed)
..   NAME:  Jose HerbRobert Norris, MRN:  [id number], DOB:  11-01-1960, LOS: 3 ADMISSION DATE:  03/20/18, CONSULTATION DATE:  03/20/18 REFERRING MD:  DANFORD,CHRISTOPHER MD, CHIEF COMPLAINT:  RESP DISTRESS   Brief History   57 yr old M [place] [facility]  Was admitted there on 09/03/18. Was being managed for progressive shortness of breath, noncompliant as outpatient due to bad insurance.  Pt ran out of his fluid pills and borrowed someone else's but it was not effective. He was noted to have weight gain and palpitations for a week prior to presentation.   Transferred to the ICU for suspected EtOH withdrawal, respiratory failure requiring intubation.  Past Medical History  Alcoholic Liver cirrhosis Hepatitis C- viral load increased in comparison to prior Esophageal varices- EGD 05/25/18 PE- previously on Eliquis LE DVTs Atrial fibrillation(paroxysmal) DM HTN Diastolic CHF BPH  Significant Hospital Events   Tachycardia this am, cardizem gtt at 15 mg / hr Lopressor pushes for HR > 150 Increased oxygen needs , now 70% per vent CVP's 22-24 CXR with some edema noted  Consults:  None at this time  Procedures:  OETT 12/17 > Lt IJ 12/17 >   Paracentesis - 12/13 in [place] ER 60 cc for diagnostic evaluation followed by 5.5L and received one dose of 25g albumin at that time.  Paracentesis 12/18 > 5 L removed. Albumin x 2 given  Significant Diagnostic Tests:  Chest x-ray 12/20 /2019-  Cardiomegaly with vascular congestion. Low volumes with bibasilar atelectasis and layering effusions. No significant  Change.  12/19 Echo The cavity size was normal. Wall thickness was   normal. Systolic function was normal. The estimated ejection   fraction was in the range of 50% to 55%. - Aortic valve: There was no regurgitation. - Mitral valve: There was no regurgitation. - Left atrium: The atrium was normal in size. - Right atrium: The atrium was normal in size. - Tricuspid valve: There  was mild regurgitation. - Pulmonary arteries: Systolic pressure could not be accurately   estimated.  Micro Data:  Pending [facility]- per Rn cx was negative. Cell count ( mod WBC) no numbers per RN  - blood cx x 2 - 12/17>> - tracheal cx - 12/17>>GS>> rare gram + cocci, rare gram + variable rod 09/08/2018 urine culture negative 02/07/2018 paracentesis fluid culture>>  Antimicrobials:  - paper record does not indicate any previous abx- [facility]   - Ceftriaxone: Jul 16, 2018  - + 1900 cc's  Objective   Blood pressure 104/70, pulse (!) 139, temperature 99.9 F (37.7 C), resp. rate (!) 23, height 5\' 10"  (1.778 m), weight (!) 159.9 kg, SpO2 (!) 88 %. CVP:  [22 mmHg-25 mmHg] 22 mmHg  Vent Mode: PRVC FiO2 (%):  [50 %-70 %] 70 % Set Rate:  [20 bmp] 20 bmp Vt Set:  [250 mL-580 mL] 580 mL PEEP:  [5 cmH20] 5 cmH20 Plateau Pressure:  [23 cmH20-26 cmH20] 25 cmH20   Intake/Output Summary (Last 24 hours) at 09/10/2018 1003 Last data filed at 09/10/2018 0600 Gross per 24 hour  Intake 1566.41 ml  Output 750 ml  Net 816.41 ml   Filed Weights   09/08/18 0447 09/09/18 0454 09/10/18 0500  Weight: (!) 163 kg (!) 156 kg (!) 159.9 kg    Examination: General: Morbidly obese male, sedated, intubated , tachycardic HEENT: NCAT, thick neck, Endotracheal tube in place, + JVD Neuro: Sedated and intubated, not following commands, MAE x 4 CV: S1,  S2, Irr, Atrial fibrillation non rate controlled PULM: even/non-labored, lungs bilaterally diminished in the bases, coarse BS throughout, few rales per posterior bases GI: Obese, distended, positive but diminished  Bowel sounds Extremities: warm/dry, 2+  Generalized edema  Skin: no rashes or lesions, warm and dry   Assessment & Plan:  Acute hypoxic respiratory failure: Requiring increase in FiO2 12/20 to 70% CXR with vascular congestion - Vent bundle - No weaning today due to increased oxygen need  and HR - Consider lasix 20 mg once HR is better controlled - underlying COPD which will impede weaning - Morbid obesity/ cirrhosis will  impede weaning - CXR daily   Hypotensive on presentation- ? Septic shock Tacycardia Atrial Fibrillation with RVR -Currently off pressor support -CVP noted to be 22-24 on 12/20 - EF 50-55% - CHADS2VASC- 4 ( HTN, diastolic CHF h/o PE and Diabetes in h/o), shock,  Plan - MAP goal > 65 - Continue Cardizem - Continue Lopressor Q 4  And prn HR > 150 - Consider Esmolol  if HR remains uncontrolled - If pressor needed , neo ordered - Consider cards consult - EKG prn - Trend Troponins - Continue Heparin   Liver cirrhosis (combination of alcohol induced injury and hep C) - on CT compared to Aug 2019 unchanged nodular hepatic contour noted w- MELD 23 (19.6% 3 month mortality) - No -compliant with lactulose / GI follow up - Hepatic encephalopathy  last Ammonia prior to transfer was 89 will start on Lactulose Enema BID.  09/10/2018 ammonia level 36 Plan - Continue Lactulose - trend Ammonia levels - Trend LFT's - Consider paracentesis prn - Consider GI consult  Hepatitis C h/o with increased viral load-  Non-compliant with follow up and treatment -Outpatient follow-up  CIWA protocol for alcoholism Continue current treatment  Macrocytic anemia -Trend CBC - Continue Thiamine and  folic acid  Mod to large ascites with mesenteric edema and received paracentesis @ [place] ED- they sent cytology, cell count, gram stain and cultures. 5.5 L removed and pt received Albumin post Repeat paracentesis 09/09/2018 with 5 L  -continue to monitor -Paracentesis prn  H/o esophageal varices  Continue with gastric tube No further evidence of GI bleeding Plan PPI Tube feeding almost at goal Monitor for bleeding  Hypoosmolar Hyponatremia: h/o beer drinking.  Na had improved during admission to [place].  Plan -Monitor sodium currently 138 on  12/20  Thrombocytopenia  Platelets 103, 000 on 12/20 - Monitor platelets - CBC daily - Monitor for bleeding  Acute kidney Injury  creatinine stable at 1.77 Hyper mag Calcium corrects to 8.7 Plan Monitor renal function Replete electrolytes as needed Strict I&O  Abdomen ?cellulitis - Day 2 of ceftriaxone - Monitor culture data - Trend fever and WBC - Culture as is clinically indicated     Best practice:  Diet: TF Pain/Anxiety/Delirium protocol (if indicated): Fentanyl continuous and Versed  VAP protocol (if indicated): yes DVT prophylaxis: heparin gtt no bolus GI prophylaxis: PPI Glucose control: yes ISS Phase 1 protocol Mobility: bedrest Code Status: FULL Family Communication: 09/10/2018 wife at bedside and updated Disposition: ICU  Labs   CBC: Recent Labs  Lab 2018-01-22 2216 09/08/18 0524 09/09/18 1326 09/10/18 0409  WBC 5.9 6.7 8.1 9.0  NEUTROABS 4.1  --   --   --   HGB 10.6* 10.1* 10.9* 11.8*  HCT 32.2* 31.4* 34.0* 37.4*  MCV 111.8* 110.6* 112.2* 111.3*  PLT 103* 101* 90* 103*    Basic Metabolic Panel: Recent Labs  Lab 2018-01-22  2216 09/08/18 0524 09/08/18 1629 09/08/18 2027 09/09/18 0444 09/09/18 1326 09/09/18 2327 09/10/18 0409  NA 134* 133*  --   --  138 138  --  138  K 4.4 4.2  --   --  3.5 3.5  --  3.8  CL 96* 96*  --   --  102 103  --  104  CO2 25 26  --   --  25 23  --  24  GLUCOSE 121* 110*  --   --  127* 118*  --  139*  BUN 26* 28*  --   --  34* 34*  --  35*  CREATININE 2.64* 2.41*  --   --  2.35* 2.08*  --  1.77*  CALCIUM 8.4* 8.3*  --   --  8.4* 8.1*  --  8.3*  MG 2.6* 2.5* 2.6* 2.7* 2.7*  --  2.6* 2.6*  PHOS 5.1* 5.1* 5.6* 6.3* 5.5*  --  4.4 4.2   GFR: Estimated Creatinine Clearance: 71.9 mL/min (A) (by C-G formula based on SCr of 1.77 mg/dL (H)). Recent Labs  Lab 09-10-18 2216 Sep 10, 2018 2235 09/08/18 0524 09/09/18 1326 09/10/18 0409  PROCALCITON  --   --   --  <0.10 0.12  WBC 5.9  --  6.7 8.1 9.0  LATICACIDVEN  --  1.4  1.3  --   --     Liver Function Tests: Recent Labs  Lab 2018-09-10 2216 09/08/18 1025  AST 32 32  ALT 21 19  ALKPHOS 77 68  BILITOT 3.8* 3.2*  PROT 7.5 7.3  ALBUMIN 3.5 3.1*   No results for input(s): LIPASE, AMYLASE in the last 168 hours. Recent Labs  Lab 09/08/18 0942 09/09/18 0444  AMMONIA 34 36*    ABG    Component Value Date/Time   PHART 7.362 09/10/2018 0345   PCO2ART 45.0 09/10/2018 0345   PO2ART 131.0 (H) 09/10/2018 0345   HCO3 25.2 09/10/2018 0345   TCO2 27 09/10/2018 0345   ACIDBASEDEF 1.0 09/08/2018 2306   O2SAT 99.0 09/10/2018 0345     Coagulation Profile: Recent Labs  Lab 2018-09-10 2216 09/10/18 0409  INR 2.01 1.71    Cardiac Enzymes: Recent Labs  Lab 09-10-18 2216 09/08/18 0524 09/08/18 1025  TROPONINI <0.03 0.03* <0.03    HbA1C: Hgb A1c MFr Bld  Date/Time Value Ref Range Status  09/10/2018 10:35 PM 4.8 4.8 - 5.6 % Final    Comment:    (NOTE) Pre diabetes:          5.7%-6.4% Diabetes:              >6.4% Glycemic control for   <7.0% adults with diabetes     CBG: Recent Labs  Lab 09/09/18 1541 09/09/18 1923 09/09/18 2331 09/10/18 0318 09/10/18 0719  GLUCAP 108* 120* 120* 127* 127*   Critical care time: 35 mins    Bevelyn Ngo, AGACNP-BC [facility] Pulmonary/Critical Care Medicine Pager # [phone] 09/10/2018, 10:03 AM

## 2018-09-10 NOTE — Progress Notes (Signed)
ANTICOAGULATION CONSULT NOTE  Pharmacy Consult for Heparin Indication: Atrial fibrillation, history of PE  Patient Measurements: Ht 68 in Wt 164 kg IBW: 68.4 kg Heparin dosing wt: 109 kg  Vital Signs: Temp: 100 F (37.8 C) (12/20 0800) Temp Source: Bladder (12/20 0800) BP: 104/70 (12/20 0800) Pulse Rate: 162 (12/20 0800)  Labs: Recent Labs    Feb 05, 2018 2216 09/08/18 0524 09/08/18 1025  09/09/18 0444 09/09/18 1326 09/10/18 0409  HGB 10.6* 10.1*  --   --   --  10.9* 11.8*  HCT 32.2* 31.4*  --   --   --  34.0* 37.4*  PLT 103* 101*  --   --   --  90* 103*  APTT 39*  --  57*   < > 86* 85* 66*  LABPROT 22.5*  --   --   --   --   --  19.9*  INR 2.01  --   --   --   --   --  1.71  HEPARINUNFRC  --   --  1.84*  --  1.24*  --   --   CREATININE 2.64* 2.41*  --   --  2.35* 2.08* 1.77*  TROPONINI <0.03 0.03* <0.03  --   --   --   --    < > = values in this interval not displayed.     Assessment: 57 [facility] with likely worsening hepatic encephalopathy. He was on Apixaban PTA with questionable compliance. [facility] getting [facility] with last dose 12/16 at 2038. APTT is therapeutic on the low end of reference range this morning. CBC wnl.  Goal of Therapy:  Heparin level 0.3-0.7 units/ml aPTT 66-102 seconds Monitor platelets by anticoagulation protocol: Yes  Plan:  -Increase heparin gtt to 1500 units/hr -Daily HL, aPTT, CBC -Confirmatory aPTT this afternoon   Baldemar FridayMasters, Emie Sommerfeld M 09/10/2018 9:25 AM

## 2018-09-10 NOTE — Progress Notes (Signed)
Patient oozing blood tinged fluid from mouth.  Unsure if it is from lungs or stomach.  Patient has not been tapped in 24hrs so could be flash pulmonary edema.  Emesis is frothy and pink tinged.  Tube feed stopped, OGT attached to LCWS.  Total bed change done.  MD Dederding notified.  I asked about stopping the heparin gtt, getting patient tapped, and let her know that I stopped tube feed.  Heparin gtt still going, TAP put off until tomorrow, tube feeds remain off andf OGT to LCWS.  Still getting pink tinged frothy fluid from ballard when ett suctioning.  Continuing to closely monitor

## 2018-09-11 ENCOUNTER — Inpatient Hospital Stay (HOSPITAL_COMMUNITY): Payer: Medicaid Other

## 2018-09-11 LAB — COMPREHENSIVE METABOLIC PANEL
ALT: 17 U/L (ref 0–44)
AST: 30 U/L (ref 15–41)
Albumin: 2.7 g/dL — ABNORMAL LOW (ref 3.5–5.0)
Alkaline Phosphatase: 59 U/L (ref 38–126)
Anion gap: 12 (ref 5–15)
BUN: 40 mg/dL — ABNORMAL HIGH (ref 6–20)
CALCIUM: 8.4 mg/dL — AB (ref 8.9–10.3)
CO2: 25 mmol/L (ref 22–32)
Chloride: 103 mmol/L (ref 98–111)
Creatinine, Ser: 1.92 mg/dL — ABNORMAL HIGH (ref 0.61–1.24)
GFR calc Af Amer: 44 mL/min — ABNORMAL LOW (ref >60)
GFR, EST NON AFRICAN AMERICAN: 38 mL/min — AB (ref >60)
Glucose, Bld: 134 mg/dL — ABNORMAL HIGH (ref 70–99)
Potassium: 4 mmol/L (ref 3.5–5.1)
Sodium: 140 mmol/L (ref 135–145)
Total Bilirubin: 2.4 mg/dL — ABNORMAL HIGH (ref 0.3–1.2)
Total Protein: 7.2 g/dL (ref 6.5–8.1)

## 2018-09-11 LAB — GLUCOSE, CAPILLARY
Glucose-Capillary: 119 mg/dL — ABNORMAL HIGH (ref 70–99)
Glucose-Capillary: 127 mg/dL — ABNORMAL HIGH (ref 70–99)
Glucose-Capillary: 131 mg/dL — ABNORMAL HIGH (ref 70–99)
Glucose-Capillary: 132 mg/dL — ABNORMAL HIGH (ref 70–99)
Glucose-Capillary: 143 mg/dL — ABNORMAL HIGH (ref 70–99)
Glucose-Capillary: 94 mg/dL (ref 70–99)
Glucose-Capillary: 99 mg/dL (ref 70–99)

## 2018-09-11 LAB — CULTURE, RESPIRATORY

## 2018-09-11 LAB — AMMONIA: Ammonia: 30 umol/L (ref 9–35)

## 2018-09-11 LAB — CULTURE, RESPIRATORY W GRAM STAIN: Culture: NORMAL

## 2018-09-11 LAB — CBC
HCT: 36.9 % — ABNORMAL LOW (ref 39.0–52.0)
Hemoglobin: 11.8 g/dL — ABNORMAL LOW (ref 13.0–17.0)
MCH: 36.5 pg — ABNORMAL HIGH (ref 26.0–34.0)
MCHC: 32 g/dL (ref 30.0–36.0)
MCV: 114.2 fL — ABNORMAL HIGH (ref 80.0–100.0)
Platelets: 119 10*3/uL — ABNORMAL LOW (ref 150–400)
RBC: 3.23 MIL/uL — ABNORMAL LOW (ref 4.22–5.81)
RDW: 16 % — ABNORMAL HIGH (ref 11.5–15.5)
WBC: 9.4 10*3/uL (ref 4.0–10.5)
nRBC: 0 % (ref 0.0–0.2)

## 2018-09-11 LAB — TROPONIN I: Troponin I: <0.03 ng/mL (ref <0.03)

## 2018-09-11 LAB — HEPARIN LEVEL (UNFRACTIONATED): Heparin Unfractionated: 0.5 IU/mL (ref 0.30–0.70)

## 2018-09-11 LAB — MAGNESIUM: Magnesium: 2.7 mg/dL — ABNORMAL HIGH (ref 1.7–2.4)

## 2018-09-11 LAB — PROCALCITONIN: Procalcitonin: 0.25 ng/mL

## 2018-09-11 LAB — APTT: aPTT: 45 seconds — ABNORMAL HIGH (ref 24–36)

## 2018-09-11 MED ORDER — METOPROLOL TARTRATE 12.5 MG HALF TABLET
12.5000 mg | ORAL_TABLET | Freq: Two times a day (BID) | ORAL | Status: DC
  Administered 2018-09-11 – 2018-09-13 (×4): 12.5 mg
  Filled 2018-09-11 (×4): qty 1

## 2018-09-11 MED ORDER — MIDAZOLAM HCL 2 MG/2ML IJ SOLN
2.0000 mg | Freq: Once | INTRAMUSCULAR | Status: AC
  Administered 2018-09-11: 2 mg via INTRAVENOUS

## 2018-09-11 MED ORDER — PHENYLEPHRINE HCL-NACL 40-0.9 MG/250ML-% IV SOLN
0.0000 ug/min | INTRAVENOUS | Status: DC
  Administered 2018-09-11: 300 ug/min via INTRAVENOUS
  Administered 2018-09-11: 180 ug/min via INTRAVENOUS
  Filled 2018-09-11 (×4): qty 250

## 2018-09-11 MED ORDER — ETOMIDATE 2 MG/ML IV SOLN
20.0000 mg | Freq: Once | INTRAVENOUS | Status: AC
  Administered 2018-09-11: 20 mg via INTRAVENOUS

## 2018-09-11 MED ORDER — ROCURONIUM BROMIDE 50 MG/5ML IV SOLN
50.0000 mg | Freq: Once | INTRAVENOUS | Status: AC
  Administered 2018-09-11: 50 mg via INTRAVENOUS
  Filled 2018-09-11: qty 5

## 2018-09-11 MED ORDER — SODIUM CHLORIDE 0.9 % IV BOLUS
1000.0000 mL | Freq: Once | INTRAVENOUS | Status: AC
  Administered 2018-09-11: 1000 mL via INTRAVENOUS

## 2018-09-11 MED ORDER — FENTANYL NICU BOLUS VIA INFUSION
100.0000 ug | Freq: Once | INTRAVENOUS | Status: AC
  Administered 2018-09-11: 100 ug via INTRAVENOUS
  Filled 2018-09-11: qty 10

## 2018-09-11 MED ORDER — FUROSEMIDE 10 MG/ML IJ SOLN
40.0000 mg | Freq: Two times a day (BID) | INTRAMUSCULAR | Status: DC
  Administered 2018-09-11 – 2018-09-13 (×4): 40 mg via INTRAVENOUS
  Filled 2018-09-11 (×4): qty 4

## 2018-09-11 NOTE — Procedures (Signed)
Bronchoscopy Procedure Note Jose HerbRobert Norris [id number] Jan 26, 1961  Procedure: Bronchoscopy Indications: Diagnostic evaluation of the airways and Obtain specimens for culture and/or other diagnostic studies, repositioning ETT as we are unable to advance the ET tube in spite of multiple attempts.  Procedure Details Consent: Risks of procedure as well as the alternatives and risks of each were explained to the (patient/caregiver).  Consent for procedure obtained. Time Out: Verified patient identification, verified procedure, site/side was marked, verified correct patient position, special equipment/implants available, medications/allergies/relevent history reviewed, required imaging and test results available.  Performed  In preparation for procedure, patient was given 100% FiO2 and bronchoscope lubricated. Sedation: Benzodiazepines, Muscle relaxants and Etomidate  The end of the ETT was just below the vocal cords, jammed up against the side of the trachea.  I could not see any significant tracheal narrowing.  I advanced the scope past the obstruction to the level of the carina and advanced the ET tube into adequate position with the bronchoscope.   After the ET tube was secured the area was entered and bilateral lungs are examined.  Copious purulent secretions noted bilaterally. Secretions were cleared with multiple installations of normal saline.  Bronchial washings sent for culture.  Evaluation Hemodynamic Status: BP stable throughout; O2 sats: stable throughout Patient's Current Condition: stable Specimens:  Sent purulent fluid Complications: No apparent complications Patient did tolerate procedure well.   Jose Norris 09/11/2018

## 2018-09-11 NOTE — Progress Notes (Addendum)
..   NAME:  Jose Norris, MRN:  [id number], DOB:  Feb 05, 1961, LOS: 4 ADMISSION DATE:  03/14/2018, CONSULTATION DATE:  03/14/2018 REFERRING MD:  DANFORD,CHRISTOPHER MD, CHIEF COMPLAINT:  RESP DISTRESS   Brief History   57 yr old M presenting from [place]  Was admitted there on 09/03/18. Was being managed for progressive shortness of breath, noncompliant as outpatient due to bad insurance.  Pt ran out of his fluid pills and borrowed someone else's but it was not effective. He was noted to have weight gain and palpitations for a week prior to presentation.   Transferred to the ICU for suspected EtOH withdrawal, respiratory failure requiring intubation.  Past Medical History  Alcoholic Liver cirrhosis Hepatitis C- viral load increased in comparison to prior Esophageal varices- EGD 05/25/18 PE- previously on Eliquis LE DVTs Atrial fibrillation(paroxysmal) DM HTN Diastolic CHF BPH  Significant Hospital Events   Tachycardia this am, cardizem gtt at 15 mg / hr Lopressor pushes for HR > 150 Increased oxygen needs , now 70% per vent CVP's 22-24 CXR with some edema noted  Consults:  None at this time  Procedures:  OETT 12/17 > Lt IJ 12/17 >   Paracentesis - 12/13 in [facility] 60 cc for diagnostic evaluation followed by 5.5L and received one dose of 25g albumin at that time.  Paracentesis 12/18 > 5 L removed. Albumin x 2 given  Significant Diagnostic Tests:  Chest x-ray 09/11/2018 shows bilateral effusion and vascular congestion vascular crowding with endotracheal tube high. Cardiomegaly with vascular congestion. Low volumes with bibasilar atelectasis and layering effusions. No significant  Change.  12/19 Echo The cavity size was normal. Wall thickness was   normal. Systolic function was normal. The estimated ejection   fraction was in the range of 50% to 55%. - Aortic valve: There was no regurgitation. - Mitral valve: There was no regurgitation. - Left atrium: The atrium  was normal in size. - Right atrium: The atrium was normal in size. - Tricuspid valve: There was mild regurgitation. - Pulmonary arteries: Systolic pressure could not be accurately   estimated.  Micro Data:  Pending Paracentesis results from [facility]- per Rn cx was negative. Cell count ( mod WBC) no numbers per RN  - blood cx x 2 - 12/17>> - tracheal cx - 12/17>>GS>> rare gram + cocci, rare gram + variable rod 09/08/2018 urine culture negative 02/07/2018 paracentesis fluid culture>>  Antimicrobials:  - paper record does not indicate any previous abx- confirmed no anx at [place] with RN there   - Ceftriaxone: 01/07/18  - + 1900 cc's  Objective   Blood pressure 111/71, pulse (!) 172, temperature 97.7 F (36.5 C), resp. rate 20, height 5\' 10"  (1.778 m), weight (!) 162 kg, SpO2 98 %. CVP:  [20 mmHg-30 mmHg] 20 mmHg  Vent Mode: PRVC FiO2 (%):  [50 %-100 %] 50 % Set Rate:  [20 bmp] 20 bmp Vt Set:  [580 mL] 580 mL PEEP:  [5 cmH20] 5 cmH20 Plateau Pressure:  [20 cmH20-29 cmH20] 20 cmH20   Intake/Output Summary (Last 24 hours) at 09/11/2018 0933 Last data filed at 09/11/2018 0800 Gross per 24 hour  Intake 2746.5 ml  Output 2900 ml  Net -153.5 ml   Filed Weights   09/09/18 0454 09/10/18 0500 09/11/18 0500  Weight: (!) 156 kg (!) 159.9 kg (!) 162 kg    Examination: General: Volume overloaded male currently on full mechanical ventilatory support with sedation HEENT: Endotracheal tube in place, orogastric tube with old blood. Neuro:  Fully sedated response to noxious stimuli by grams CV: Heart sounds are irregular, atrial fibrillation with intermittent ventricular response of 130, currently on Cardizem drip with heart rate 120 PULM: even/non-labored, lungs bilaterally decreased breath sounds in the base, GI: Distended, tense, no overt fluid wave. Extremities: warm/dry, 3+ edema  Skin: no rashes or lesions    Assessment & Plan:  Acute hypoxic respiratory failure: Requiring  increase in FiO2 12/20 to 70% CXR with vascular congestion 1221 endotracheal tube noted to be high on chest x-ray pushed down 2 cm chest x-ray repeat is pending -Body habitus fluid overload abdominal stanchion prevent any type of meaningful weaning from ventilatory support -Vent bundle esity/ cirrhosis will  impede weaning - CXR daily -Negative identified -PRN paracentesis -Needs end-of-life discussions   Hypotensive on presentation- ? Septic shock Tacycardia Atrial Fibrillation with RVR -Currently off pressor support -CVP noted to be 22-24 on 12/20 - EF 50-55% - CHADS2VASC- 4 ( HTN, diastolic CHF h/o PE and Diabetes in h/o), shock,  Plan -Mean arterial pressure goal greater than 60 -Continue Cardizem for atrial fibrillation goal of heart rate less than 120 -P.o. metoprolol started 09/11/2018 -Pressor support as needed Neo-Synephrine - Consider cards consult -DC heparin due to gastric bleed in somebody with known esophageal varices would not resume heparin at any time.   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  09/10/2018 ammonia level 36 Plan -Monitor ammonia level -Continue lactulose   Hepatitis C h/o with increased viral load-  Non-compliant with follow up and treatment -Outpatient follow-up  CIWA protocol for alcoholism Continue  Macrocytic anemia -CBC  Mod to large ascites with mesenteric edema and received paracentesis @ [place] [facility]- they sent cytology, cell count, gram stain and cultures. 5.5 L removed and pt received Albumin post Repeat paracentesis 09/09/2018 with 5 L  Intake/Output Summary (Last 24 hours) at 09/11/2018 1610 Last data filed at 09/11/2018 0800 Gross per 24 hour  Intake  2746.5 ml  Output 2900 ml  Net -153.5 ml    -Continue to monitor -Diuresis as tolerated -PRN paracentesis  H/o esophageal varices  Orogastric c tube Bleeding from orogastric tube 1221 Plan Continue PPI Resume tube feeding Heparin drip  Hypoosmolar Hyponatremia: h/o beer drinking.  Resolved Plan -Monitor sodium currently 140 on 09/11/2017  Thrombocytopenia  Platelets 119 on 09/11/2018 -Monitor platelets and CBC -Note bleeding from OG tube on 09/11/2018 -Stopped heparin drip for atrial fibrillation due to bleeding  Acute kidney Injury  Creatinine 1.92 up from 1.77 Hyper magnesium Calcium corrects Plan Monitor renal function Diuresis as tolerated  Abdomen ?cellulitis - Day 4 of ceftriaxone - Monitor culture data - Trend fever and WBC - Culture as is clinically indicated     Best practice:  Diet: TF, 09/11/2018 currently on hold per RN Pain/Anxiety/Delirium protocol (if indicated): Fentanyl continuous and Versed  VAP protocol (if indicated): yes DVT prophylaxis: heparin gtt no bolus GI prophylaxis: PPI Glucose control: yes ISS Phase 1 protocol Mobility: bedrest Code Status: FULL Family Communication: 21 2019 no family bedside Disposition: ICU  Labs   CBC: Recent Labs  Lab 2018/09/17 2216 09/08/18 0524 09/09/18 1326 09/10/18 0409 09/11/18 0437  WBC 5.9 6.7 8.1 9.0 9.4  NEUTROABS 4.1  --   --   --   --  HGB 10.6* 10.1* 10.9* 11.8* 11.8*  HCT 32.2* 31.4* 34.0* 37.4* 36.9*  MCV 111.8* 110.6* 112.2* 111.3* 114.2*  PLT 103* 101* 90* 103* 119*    Basic Metabolic Panel: Recent Labs  Lab 09/08/18 0524 09/08/18 1629 09/08/18 2027 09/09/18 0444 09/09/18 1326 09/09/18 2327 09/10/18 0409 09/11/18 0437  NA 133*  --   --  138 138  --  138 140  K 4.2  --   --  3.5 3.5  --  3.8 4.0  CL 96*  --   --  102 103  --  104 103  CO2 26  --   --  25 23  --  24 25  GLUCOSE 110*  --   --  127* 118*  --  139* 134*  BUN 28*  --   --  34* 34*  --  35* 40*    CREATININE 2.41*  --   --  2.35* 2.08*  --  1.77* 1.92*  CALCIUM 8.3*  --   --  8.4* 8.1*  --  8.3* 8.4*  MG 2.5* 2.6* 2.7* 2.7*  --  2.6* 2.6* 2.7*  PHOS 5.1* 5.6* 6.3* 5.5*  --  4.4 4.2  --    GFR: Estimated Creatinine Clearance: 66.8 mL/min (A) (by C-G formula based on SCr of 1.92 mg/dL (H)). Recent Labs  Lab 09/05/2018 2235 09/08/18 0524 09/09/18 1326 09/10/18 0409 09/11/18 0437  PROCALCITON  --   --  <0.10 0.12 0.25  WBC  --  6.7 8.1 9.0 9.4  LATICACIDVEN 1.4 1.3  --   --   --     Liver Function Tests: Recent Labs  Lab 09/06/2018 2216 09/08/18 1025 09/11/18 0437  AST 32 32 30  ALT 21 19 17   ALKPHOS 77 68 59  BILITOT 3.8* 3.2* 2.4*  PROT 7.5 7.3 7.2  ALBUMIN 3.5 3.1* 2.7*   No results for input(s): LIPASE, AMYLASE in the last 168 hours. Recent Labs  Lab 09/08/18 0942 09/09/18 0444 09/11/18 0440  AMMONIA 34 36* 30    ABG    Component Value Date/Time   PHART 7.362 09/10/2018 0345   PCO2ART 45.0 09/10/2018 0345   PO2ART 131.0 (H) 09/10/2018 0345   HCO3 25.2 09/10/2018 0345   TCO2 27 09/10/2018 0345   ACIDBASEDEF 1.0 09/08/2018 2306   O2SAT 99.0 09/10/2018 0345     Coagulation Profile: Recent Labs  Lab 09/08/2018 2216 09/10/18 0409  INR 2.01 1.71    Cardiac Enzymes: Recent Labs  Lab 09/06/2018 2216 09/08/18 0524 09/08/18 1025 09/11/18 0437  TROPONINI <0.03 0.03* <0.03 <0.03    HbA1C: Hgb A1c MFr Bld  Date/Time Value Ref Range Status  08/30/2018 10:35 PM 4.8 4.8 - 5.6 % Final    Comment:    (NOTE) Pre diabetes:          5.7%-6.4% Diabetes:              >6.4% Glycemic control for   <7.0% adults with diabetes     CBG: Recent Labs  Lab 09/10/18 1513 09/10/18 1948 09/11/18 0006 09/11/18 0324 09/11/18 0729  GLUCAP 151* 130* 132* 119* 127*   Critical care time: 35 mins    Jose Norris ACNP Jose Norris PCCM Pager [phone] till 1 pm If no answer page [phone] 09/11/2018, 9:36 AM

## 2018-09-11 NOTE — Progress Notes (Signed)
RT NOTE: RT noticed ETT at 20cm at the lips. RT moved tube back to correction position with second RRT. ETT is now back to 23 cm at the lips. RT will continue to monitor.

## 2018-09-12 ENCOUNTER — Inpatient Hospital Stay (HOSPITAL_COMMUNITY): Payer: Medicaid Other

## 2018-09-12 LAB — CULTURE, BLOOD (ROUTINE X 2)
CULTURE: NO GROWTH
Culture: NO GROWTH
Special Requests: ADEQUATE
Special Requests: ADEQUATE

## 2018-09-12 LAB — BODY FLUID CULTURE: Culture: NO GROWTH

## 2018-09-12 LAB — GLUCOSE, CAPILLARY
Glucose-Capillary: 105 mg/dL — ABNORMAL HIGH (ref 70–99)
Glucose-Capillary: 115 mg/dL — ABNORMAL HIGH (ref 70–99)
Glucose-Capillary: 118 mg/dL — ABNORMAL HIGH (ref 70–99)
Glucose-Capillary: 136 mg/dL — ABNORMAL HIGH (ref 70–99)
Glucose-Capillary: 96 mg/dL (ref 70–99)

## 2018-09-12 LAB — BASIC METABOLIC PANEL
Anion gap: 10 (ref 5–15)
BUN: 42 mg/dL — ABNORMAL HIGH (ref 6–20)
CALCIUM: 8.5 mg/dL — AB (ref 8.9–10.3)
CO2: 24 mmol/L (ref 22–32)
Chloride: 105 mmol/L (ref 98–111)
Creatinine, Ser: 1.6 mg/dL — ABNORMAL HIGH (ref 0.61–1.24)
GFR calc Af Amer: 55 mL/min — ABNORMAL LOW (ref >60)
GFR calc non Af Amer: 48 mL/min — ABNORMAL LOW (ref >60)
Glucose, Bld: 114 mg/dL — ABNORMAL HIGH (ref 70–99)
Potassium: 3.9 mmol/L (ref 3.5–5.1)
Sodium: 139 mmol/L (ref 135–145)

## 2018-09-12 LAB — CBC
HCT: 35 % — ABNORMAL LOW (ref 39.0–52.0)
Hemoglobin: 11.1 g/dL — ABNORMAL LOW (ref 13.0–17.0)
MCH: 36.4 pg — ABNORMAL HIGH (ref 26.0–34.0)
MCHC: 31.7 g/dL (ref 30.0–36.0)
MCV: 114.8 fL — ABNORMAL HIGH (ref 80.0–100.0)
Platelets: 142 10*3/uL — ABNORMAL LOW (ref 150–400)
RBC: 3.05 MIL/uL — ABNORMAL LOW (ref 4.22–5.81)
RDW: 16.2 % — ABNORMAL HIGH (ref 11.5–15.5)
WBC: 9.1 10*3/uL (ref 4.0–10.5)
nRBC: 0 % (ref 0.0–0.2)

## 2018-09-12 LAB — MAGNESIUM: Magnesium: 2.6 mg/dL — ABNORMAL HIGH (ref 1.7–2.4)

## 2018-09-12 LAB — PHOSPHORUS: Phosphorus: 3.6 mg/dL (ref 2.5–4.6)

## 2018-09-12 MED ORDER — SODIUM CHLORIDE 0.9 % IV SOLN
0.0000 ug/min | INTRAVENOUS | Status: DC
  Administered 2018-09-12: 180 ug/min via INTRAVENOUS
  Administered 2018-09-12: 120 ug/min via INTRAVENOUS
  Administered 2018-09-12: 90 ug/min via INTRAVENOUS
  Administered 2018-09-12: 135 ug/min via INTRAVENOUS
  Administered 2018-09-13: 28 ug/min via INTRAVENOUS
  Filled 2018-09-12 (×2): qty 40
  Filled 2018-09-12: qty 4
  Filled 2018-09-12 (×2): qty 40

## 2018-09-12 NOTE — Progress Notes (Signed)
Pt's wife has informed me that they will hold off on comfort care until pt's son can arrive from [place] tomorrow night.

## 2018-09-12 NOTE — Progress Notes (Addendum)
..   NAME:  Jose Norris, MRN:  [id number], DOB:  [birth year], LOS: 5 ADMISSION DATE:  08/24/2018, CONSULTATION DATE:  08/26/2018 REFERRING MD:  DANFORD,CHRISTOPHER MD, CHIEF COMPLAINT:  RESP DISTRESS   Brief History   57 yr old M presenting from [place]  Was admitted there on 09/03/18. Was being managed for progressive shortness of breath, noncompliant as outpatient due to bad insurance.  Pt ran out of his fluid pills and borrowed someone else's but it was not effective. He was noted to have weight gain and palpitations for a week prior to presentation.   Transferred to the ICU for suspected EtOH withdrawal, respiratory failure requiring intubation.  Past Medical History  Alcoholic Liver cirrhosis Hepatitis C- viral load increased in comparison to prior Esophageal varices- EGD 05/25/18 PE- previously on Eliquis LE DVTs Atrial fibrillation(paroxysmal) DM HTN Diastolic CHF BPH  Significant Hospital Events   Tachycardia this am, cardizem gtt at 15 mg / hr Lopressor pushes for HR > 150 Increased oxygen needs , now 70% per vent CVP's 22-24 CXR with some edema noted  Consults:  None at this time  Procedures:  OETT 12/17 > Lt IJ 12/17 >   Paracentesis - 12/13 in [facility] ER 60 cc for diagnostic evaluation followed by 5.5L and received one dose of 25g albumin at that time.  Paracentesis 12/18 > 5 L removed. Albumin x 2 given  Significant Diagnostic Tests:  Chest x-ray 09/11/2018 shows bilateral effusion and vascular congestion vascular crowding with endotracheal tube high. Cardiomegaly with vascular congestion. Low volumes with bibasilar atelectasis and layering effusions. No significant  Change.  12/19 Echo The cavity size was normal. Wall thickness was   normal. Systolic function was normal. The estimated ejection   fraction was in the range of 50% to 55%. - Aortic valve: There was no regurgitation. - Mitral valve: There was no regurgitation. - Left atrium: The atrium  was normal in size. - Right atrium: The atrium was normal in size. - Tricuspid valve: There was mild regurgitation. - Pulmonary arteries: Systolic pressure could not be accurately   estimated.  Micro Data:  Pending Paracentesis results from [facility]- per Rn cx was negative. Cell count ( mod WBC) no numbers per RN  - blood cx x 2 - 12/17>> - tracheal cx - 12/17>>GS>> rare gram + cocci, rare gram + variable rod normal flora 09/08/2018 urine culture negative 09/09/2018 paracentesis fluid culture>> negative 09/11/2018 BAL>  Antimicrobials:  - paper record does not indicate any previous abx- confirmed no anx at [facility] with RN there   - Ceftriaxone: 08/25/2018  - + 1900 cc's  Objective   Blood pressure 99/63, pulse (!) 137, temperature 99.9 F (37.7 C), temperature source Axillary, resp. rate 19, height _0  (1.778 m), weight (!) 162 kg, SpO2 97 %. CVP:  [18 mmHg-26 mmHg] 26 mmHg  Vent Mode: PRVC FiO2 (%):  [40 %-50 %] 40 % Set Rate:  [20 bmp] 20 bmp Vt Set:  [540 mL-580 mL] 540 mL PEEP:  [5 cmH20] 5 cmH20 Plateau Pressure:  [20 cmH20-23 cmH20] 20 cmH20   Intake/Output Summary (Last 24 hours) at 09/12/2018 1035 Last data filed at 09/12/2018 0900 Gross per 24 hour  Intake 3194.88 ml  Output 3475 ml  Net -280.12 ml   Filed Weights   09/09/18 0454 09/10/18 0500 09/11/18 0500  Weight: (!) 156 kg (!) 159.9 kg (!) 162 kg    Examination: General: Morbidly obese male currently on full mechanical ventilatory support HEENT: Endotracheal tube gastric  tube in place Neuro: Startles when aroused moves all extremities CV: Heart sounds are irregular PULM: even/non-labored, lungs bilaterally decreased throughout GI: Distended, faint bowel sounds, fluid wave is noted Extremities: warm/dry, 4+ edema, multiple areas of ecchymosis noted Skin: no rashes or lesions     Assessment & Plan:  Acute hypoxic respiratory failure: Requiring increase in FiO2 12/20 to 70% CXR with vascular  congestion 1221 endotracheal tube noted to be high on chest x-ray pushed down 2 cm chest x-ray repeat is pending -Obesity along with liver failure makes any type of meaningful extubation impossible at this time -Vent bundle -Chest x-ray -PRN paracentesis -Needs end-of-life discussions   Hypotensive on presentation- ? Septic shock Tacycardia Atrial Fibrillation with RVR -Currently off pressor support -CVP noted to be 22-24 on 12/20 - EF 50-55% - CHADS2VASC- 4 ( HTN, diastolic CHF h/o PE and Diabetes in h/o), shock,  Plan -Pressors per protocol -Diltiazem drip is now off -We will p.o. Metroprolol started on 09/11/2017 -Currently requiring Neo-Synephrine - Consider cards consult -No anticoagulation for atrial fibrillation due to GI bleed   Liver cirrhosis (combination of alcohol induced injury and hep C) - on CT compared to Aug 2019 unchanged nodular hepatic contour noted w- MELD 23 (19.6% 3 month mortality) -Noncompliant with lactulose as an outpatient -GI follow-up - Hepatic encephalopathy  last Ammonia prior to transfer was 89 will start on Lactulose Enema BID.  09/10/2018 ammonia level 36, 09/11/2018 ammonia level 30 Plan -Intermittently monitor ammonia level -Continue lactulose   -Hepatitis C h/o with increased viral load-  Non-compliant with follow up and treatment -Outpatient follow-up if he survives  CIWA protocol for alcoholism Continue  Macrocytic anemia -Monitor CBC  Mod to large ascites with mesenteric edema and received paracentesis @ [facility] ED- they sent cytology, cell count, gram stain and cultures. 5.5 L removed and pt received Albumin post Repeat paracentesis 09/09/2018 with 5 L  Intake/Output Summary (Last 24 hours) at 09/12/2018 1035 Last data filed at 09/12/2018 0900 Gross per 24 hour  Intake 3194.88 ml  Output 3475 ml  Net -280.12 ml    -Continue to monitor -Continue to diurese -PRN paracentesis  H/o esophageal varices  Orogastric c  tube Bleeding from orogastric tube 1221 Plan Continue PPI Tube feed Stop heparin drip  Hypoosmolar Hyponatremia: h/o beer drinking.  Resolved Plan -Monitor sodium currently 139 on 09/12/2018  Thrombocytopenia  Platelets continue to rise and with 119 142 on 09/13/2018 -Monitor CBC with platelet -Bleeding from OG tube has stopped -Remains off heparin drip for atrial fib  Acute kidney Injury  Lab Results  Component Value Date   CREATININE 1.60 (H) 09/12/2018   CREATININE 1.92 (H) 09/11/2018   CREATININE 1.77 (H) 09/10/2018    Intake/Output Summary (Last 24 hours) at 09/12/2018 1037 Last data filed at 09/12/2018 0900 Gross per 24 hour  Intake 3194.88 ml  Output 3475 ml  Net -280.12 ml    Plan Monitor renal function Replete electrolytes as needed Diuresis as tolerated  Abdomen ?cellulitis -5 of ceftriaxone -Continue to monitor culture data      Best practice:  Diet: Tube feeding via NG tube started on 09/11/2018 still less for PEG tube Pain/Anxiety/Delirium protocol (if indicated): Fentanyl continuous and Versed  VAP protocol (if indicated): yes DVT prophylaxis: heparin gtt no bolus GI prophylaxis: PPI Glucose control: yes ISS Phase 1 protocol Mobility: bedrest Code Status: FULL Family Communication: 09/12/2018 Long discussion at bedside with wife.  She agrees with DNR. She further would like to transition to  full comfort care with one-way extubation within the near future.  We will change the CODE STATUS to DNR and at her convenience transition to full comfort care.  This will entail one-way extubation with medication to prevent suffering. Disposition: ICU  Labs   CBC: Recent Labs  Lab 09/06/2018 2216 09/08/18 0524 09/09/18 1326 09/10/18 0409 09/11/18 0437 09/12/18 0413  WBC 5.9 6.7 8.1 9.0 9.4 9.1  NEUTROABS 4.1  --   --   --   --   --   HGB 10.6* 10.1* 10.9* 11.8* 11.8* 11.1*  HCT 32.2* 31.4* 34.0* 37.4* 36.9* 35.0*  MCV 111.8* 110.6* 112.2*  111.3* 114.2* 114.8*  PLT 103* 101* 90* 103* 119* 142*    Basic Metabolic Panel: Recent Labs  Lab 09/08/18 2027 09/09/18 0444 09/09/18 1326 09/09/18 2327 09/10/18 0409 09/11/18 0437 09/12/18 0413  NA  --  138 138  --  138 140 139  K  --  3.5 3.5  --  3.8 4.0 3.9  CL  --  102 103  --  104 103 105  CO2  --  25 23  --  _0 GLUCOSE  --  127* 118*  --  139* 134* 114*  BUN  --  34* 34*  --  35* 40* 42*  CREATININE  --  2.35* 2.08*  --  1.77* 1.92* 1.60*  CALCIUM  --  8.4* 8.1*  --  8.3* 8.4* 8.5*  MG 2.7* 2.7*  --  2.6* 2.6* 2.7* 2.6*  PHOS 6.3* 5.5*  --  4.4 4.2  --  3.6   GFR: Estimated Creatinine Clearance: 80.1 mL/min (A) (by C-G formula based on SCr of 1.6 mg/dL (H)). Recent Labs  Lab 08/27/2018 2235 09/08/18 0524 09/09/18 1326 09/10/18 0409 09/11/18 0437 09/12/18 0413  PROCALCITON  --   --  <0.10 0.12 0.25  --   WBC  --  6.7 8.1 9.0 9.4 9.1  LATICACIDVEN 1.4 1.3  --   --   --   --     Liver Function Tests: Recent Labs  Lab 09/16/2018 2216 09/08/18 1025 09/11/18 0437  AST 32 32 30  ALT _1 ALKPHOS 77 68 59  BILITOT 3.8* 3.2* 2.4*  PROT 7.5 7.3 7.2  ALBUMIN 3.5 3.1* 2.7*   No results for input(s): LIPASE, AMYLASE in the last 168 hours. Recent Labs  Lab 09/08/18 0942 09/09/18 0444 09/11/18 0440  AMMONIA 34 36* 30    ABG    Component Value Date/Time   PHART 7.362 09/10/2018 0345   PCO2ART 45.0 09/10/2018 0345   PO2ART 131.0 (H) 09/10/2018 0345   HCO3 25.2 09/10/2018 0345   TCO2 27 09/10/2018 0345   ACIDBASEDEF 1.0 09/08/2018 2306   O2SAT 99.0 09/10/2018 0345     Coagulation Profile: Recent Labs  Lab 09/14/2018 2216 09/10/18 0409  INR 2.01 1.71    Cardiac Enzymes: Recent Labs  Lab 08/30/2018 2216 09/08/18 0524 09/08/18 1025 09/11/18 0437  TROPONINI <0.03 0.03* <0.03 <0.03    HbA1C: Hgb A1c MFr Bld  Date/Time Value Ref Range Status  09/02/2018 10:35 PM 4.8 4.8 - 5.6 % Final    Comment:    (NOTE) Pre diabetes:           5.7%-6.4% Diabetes:              >6.4% Glycemic control for   <7.0% adults with diabetes     CBG: Recent Labs  Lab 09/11/18 1522 09/11/18 1955 09/11/18 2338  09/12/18 0350 09/12/18 0749  GLUCAP 131* 94 99 105* 96   Critical care time: 35 mins    Richardson Landry  ACNP Maryanna Shape PCCM Pager [phone] till 1 pm If no answer page [phone] 09/12/2018, 10:35 AM

## 2018-09-13 ENCOUNTER — Inpatient Hospital Stay (HOSPITAL_COMMUNITY): Payer: Medicaid Other

## 2018-09-13 LAB — PHOSPHORUS: Phosphorus: 3.4 mg/dL (ref 2.5–4.6)

## 2018-09-13 LAB — CBC
HCT: 35.2 % — ABNORMAL LOW (ref 39.0–52.0)
Hemoglobin: 11.4 g/dL — ABNORMAL LOW (ref 13.0–17.0)
MCH: 37 pg — ABNORMAL HIGH (ref 26.0–34.0)
MCHC: 32.4 g/dL (ref 30.0–36.0)
MCV: 114.3 fL — ABNORMAL HIGH (ref 80.0–100.0)
PLATELETS: 127 10*3/uL — AB (ref 150–400)
RBC: 3.08 MIL/uL — ABNORMAL LOW (ref 4.22–5.81)
RDW: 16.3 % — ABNORMAL HIGH (ref 11.5–15.5)
WBC: 7.7 10*3/uL (ref 4.0–10.5)
nRBC: 0 % (ref 0.0–0.2)

## 2018-09-13 LAB — GLUCOSE, CAPILLARY
GLUCOSE-CAPILLARY: 123 mg/dL — AB (ref 70–99)
Glucose-Capillary: 125 mg/dL — ABNORMAL HIGH (ref 70–99)
Glucose-Capillary: 125 mg/dL — ABNORMAL HIGH (ref 70–99)

## 2018-09-13 LAB — BASIC METABOLIC PANEL
Anion gap: 9 (ref 5–15)
BUN: 42 mg/dL — ABNORMAL HIGH (ref 6–20)
CO2: 26 mmol/L (ref 22–32)
Calcium: 8.5 mg/dL — ABNORMAL LOW (ref 8.9–10.3)
Chloride: 111 mmol/L (ref 98–111)
Creatinine, Ser: 1.61 mg/dL — ABNORMAL HIGH (ref 0.61–1.24)
GFR calc Af Amer: 55 mL/min — ABNORMAL LOW (ref >60)
GFR calc non Af Amer: 47 mL/min — ABNORMAL LOW (ref >60)
Glucose, Bld: 152 mg/dL — ABNORMAL HIGH (ref 70–99)
Potassium: 3.9 mmol/L (ref 3.5–5.1)
Sodium: 146 mmol/L — ABNORMAL HIGH (ref 135–145)

## 2018-09-13 LAB — MAGNESIUM: Magnesium: 2.3 mg/dL (ref 1.7–2.4)

## 2018-09-13 LAB — TOTAL BILIRUBIN, BODY FLUID: Total bilirubin, fluid: 0.6 mg/dL

## 2018-09-13 MED ORDER — DEXTROSE 5 % IV SOLN: INTRAVENOUS | Status: DC

## 2018-09-13 MED ORDER — ACETAMINOPHEN 650 MG RE SUPP: 650.0000 mg | Freq: Four times a day (QID) | RECTAL | Status: DC | PRN

## 2018-09-13 MED ORDER — MORPHINE BOLUS VIA INFUSION
2.0000 mg | INTRAVENOUS | Status: DC | PRN
  Administered 2018-09-13 – 2018-09-14 (×4): 5 mg via INTRAVENOUS
  Filled 2018-09-13: qty 10

## 2018-09-13 MED ORDER — ACETAMINOPHEN 325 MG PO TABS: 650.0000 mg | ORAL_TABLET | Freq: Four times a day (QID) | ORAL | Status: DC | PRN

## 2018-09-13 MED ORDER — GLYCOPYRROLATE 1 MG PO TABS
1.0000 mg | ORAL_TABLET | ORAL | Status: DC | PRN
  Filled 2018-09-13: qty 1

## 2018-09-13 MED ORDER — GLYCOPYRROLATE 0.2 MG/ML IJ SOLN: 0.2000 mg | INTRAMUSCULAR | Status: DC | PRN

## 2018-09-13 MED ORDER — MORPHINE SULFATE (PF) 2 MG/ML IV SOLN
2.0000 mg | INTRAVENOUS | Status: DC | PRN
  Administered 2018-09-13: 5 mg via INTRAVENOUS
  Filled 2018-09-13 (×2): qty 5

## 2018-09-13 MED ORDER — GLYCOPYRROLATE 0.2 MG/ML IJ SOLN
0.2000 mg | INTRAMUSCULAR | Status: DC | PRN
  Administered 2018-09-13 – 2018-09-14 (×5): 0.2 mg via INTRAVENOUS
  Filled 2018-09-13 (×5): qty 1

## 2018-09-13 MED ORDER — MORPHINE 100MG IN NS 100ML (1MG/ML) PREMIX INFUSION
5.0000 mg/h | INTRAVENOUS | Status: DC
  Administered 2018-09-13: 30 mg/h via INTRAVENOUS
  Administered 2018-09-13: 2 mg/h via INTRAVENOUS
  Administered 2018-09-14 – 2018-09-15 (×8): 30 mg/h via INTRAVENOUS
  Filled 2018-09-13 (×11): qty 100

## 2018-09-13 MED ORDER — POLYVINYL ALCOHOL 1.4 % OP SOLN
1.0000 [drp] | Freq: Four times a day (QID) | OPHTHALMIC | Status: DC | PRN
  Filled 2018-09-13: qty 15

## 2018-09-13 MED ORDER — MORPHINE SULFATE (PF) 2 MG/ML IV SOLN: 2.0000 mg | INTRAVENOUS | Status: DC | PRN

## 2018-09-13 MED ORDER — MORPHINE SULFATE (PF) 2 MG/ML IV SOLN
2.0000 mg | INTRAVENOUS | Status: DC | PRN
  Administered 2018-09-13: 10 mg via INTRAVENOUS

## 2018-09-13 MED ORDER — LORAZEPAM 2 MG/ML IJ SOLN
2.0000 mg | INTRAMUSCULAR | Status: DC | PRN
  Administered 2018-09-13 – 2018-09-14 (×3): 4 mg via INTRAVENOUS
  Administered 2018-09-14 – 2018-09-15 (×3): 2 mg via INTRAVENOUS
  Filled 2018-09-13: qty 2
  Filled 2018-09-13 (×3): qty 1
  Filled 2018-09-13 (×2): qty 2

## 2018-09-13 NOTE — Progress Notes (Signed)
Nutrition Brief Note  Chart reviewed. Plan to transition to  comfort care once [place] from [place]. TF discontinued No further nutrition interventions warranted at this time.  Please re-consult as needed.   [name] MS, RD, LDN, CNSC [phone] Pager  [phone] Weekend/On-Call Pager

## 2018-09-13 NOTE — Procedures (Signed)
Extubation Procedure Note  Patient Details:   Name: Jose HerbRobert Norris DOB: 1961-07-13 MRN: [id number]   Airway Documentation:    Vent end date: 09/13/18 Vent end time: 1932   Evaluation  O2 sats: currently unstable after terminal extubation Complications: No apparent complications Patient did tolerate procedure well. Bilateral Breath Sounds: Clear, Diminished(coarse)   No  Patient was terminally extubated, oxygen saturations immediately dropped post extubation procedure.  [name] Plant, B.S, RRT, RCP 09/13/2018, 7:33 PM

## 2018-09-13 NOTE — Progress Notes (Signed)
eLink Physician-Brief Progress Note Patient Name: Jose HerbRobert Norris DOB: 1961-09-10 MRN: [id number]   Date of Service  09/13/2018  HPI/Events of Note  Extubated to comfort care RN states in distress  eICU Interventions  Increased morphine limits & pushes for comfort     Intervention Category Intermediate Interventions: Other:  [name] V. Angelette Ganus 09/13/2018, 8:15 PM

## 2018-09-13 NOTE — Progress Notes (Signed)
   09/13/18 2039  Clinical Encounter Type  Visited With Patient and family together;Health care provider  Visit Type Patient actively dying  Referral From Nurse  Consult/Referral To Chaplain  Stress Factors  Family Stress Factors Major life changes (anticipatory grief )   Chaplain responded to a request from floor nurse to support this patient.  PT surrounded by family.  Chaplain offered support and words of comfort to the family encouraging them to talk to the PT.  Offered hospitality to the family.  Wife stated PT not part of a faith community.  Family holding strong to one another.  Chaplain let RN know if they needed further support to call. Chaplain Agustin CreeNewton Keiran Sias

## 2018-09-13 NOTE — Progress Notes (Signed)
..   NAME:  Jose Norris, MRN:  [id number], DOB:  09-26-1960, LOS: 6 ADMISSION DATE:  08/26/2018, CONSULTATION DATE:  08/23/2018 REFERRING MD:  DANFORD,CHRISTOPHER MD, CHIEF COMPLAINT:  RESP DISTRESS   Brief History   57 yr old M presenting from [place]  Was admitted there on 09/03/18. Was being managed for progressive shortness of breath, noncompliant as outpatient due to bad insurance.  Pt ran out of his fluid pills and borrowed someone else's but it was not effective. He was noted to have weight gain and palpitations for a week prior to presentation.   Transferred to the ICU for suspected EtOH withdrawal, respiratory failure requiring intubation.  Past Medical History  Alcoholic Liver cirrhosis Hepatitis C- viral load increased in comparison to prior Esophageal varices- EGD 05/25/18 PE- previously on Eliquis LE DVTs Atrial fibrillation(paroxysmal) DM HTN Diastolic CHF BPH  Significant Hospital Events     Consults:    Procedures:  OETT 12/17 > Lt IJ 12/17 >   Paracentesis - 12/13 in [place] ER 60 cc for diagnostic evaluation followed by 5.5L and received one dose of 25g albumin at that time.  Paracentesis 12/18 > 5 L removed. Albumin x 2 given  Significant Diagnostic Tests:  Chest x-ray 09/11/2018 shows bilateral effusion and vascular congestion vascular crowding with endotracheal tube high. Cardiomegaly with vascular congestion. Low volumes with bibasilar atelectasis and layering effusions. No significant  Change.  12/19 Echo The cavity size was normal. Wall thickness was   normal. Systolic function was normal. The estimated ejection   fraction was in the range of 50% to 55%. - Aortic valve: There was no regurgitation. - Mitral valve: There was no regurgitation. - Left atrium: The atrium was normal in size. - Right atrium: The atrium was normal in size. - Tricuspid valve: There was mild regurgitation. - Pulmonary arteries: Systolic pressure could not be  accurately   estimated.  Micro Data:  Pending Paracentesis results from [place]- per Rn cx was negative. Cell count ( mod WBC) no numbers per RN  - blood cx x 2 - 12/17>> - tracheal cx - 12/17>>GS>> rare gram + cocci, rare gram + variable rod normal flora 09/08/2018 urine culture >>negative 09/09/2018 paracentesis fluid culture>> negative 09/11/2018 BAL>  Antimicrobials:   Ceftriaxone 12/17 (?SBP)>>>   Objective   Blood pressure (!) 97/55, pulse (!) 110, temperature 99.4 F (37.4 C), temperature source Oral, resp. rate 20, height _0  (1.778 m), weight (!) 155.8 kg, SpO2 94 %. CVP:  [18 mmHg-32 mmHg] 18 mmHg  Vent Mode: PRVC FiO2 (%):  [40 %] 40 % Set Rate:  [20 bmp] 20 bmp Vt Set:  [540 mL] 540 mL PEEP:  [5 cmH20] 5 cmH20 Plateau Pressure:  [19 cmH20-24 cmH20] 19 cmH20   Intake/Output Summary (Last 24 hours) at 09/13/2018 1003 Last data filed at 09/13/2018 0900 Gross per 24 hour  Intake 2125.54 ml  Output 4510 ml  Net -2384.46 ml   Filed Weights   09/10/18 0500 09/11/18 0500 09/13/18 0457  Weight: (!) 159.9 kg (!) 162 kg (!) 155.8 kg    Examination: General: Morbidly obese male currently on full mechanical ventilatory support HEENT: Endotracheal tube gastric tube in place Neuro: Startles when aroused moves all extremities CV: Heart sounds are irregular PULM: even/non-labored, lungs bilaterally decreased throughout GI: Distended, faint bowel sounds, fluid wave is noted Extremities: warm/dry, 4+ edema, multiple areas of ecchymosis noted Skin: no rashes or lesions     Assessment & Plan:  Acute hypoxic respiratory failure  PLAN -  Continue vent for now  DNR - no escalation  Plans for comfort and one-way extubation when family arrives late 12/23   Hypotension? 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    Labs   CBC: Recent Labs  Lab 09/02/2018 2216  09/09/18 1326 09/10/18 0409 09/11/18 0437 09/12/18 0413 09/13/18 0419  WBC 5.9   < > 8.1 9.0 9.4 9.1 7.7  NEUTROABS 4.1  --   --   --   --   --   --   HGB 10.6*   < > 10.9* 11.8* 11.8* 11.1* 11.4*  HCT 32.2*   < > 34.0* 37.4* 36.9* 35.0* 35.2*  MCV 111.8*   < > 112.2* 111.3* 114.2* 114.8* 114.3*  PLT 103*   < > 90* 103* 119* 142* 127*   < > = values in this interval not displayed.    Basic Metabolic Panel: Recent Labs  Lab 09/09/18 0444 09/09/18 1326 09/09/18 2327 09/10/18 0409 09/11/18 0437 09/12/18 0413 09/13/18 0419  NA 138 138  --  138 140 139 146*  K 3.5 3.5  --  3.8 4.0 3.9 3.9  CL 102 103  --  104 103 105 111  CO2 25 23  --  _0 GLUCOSE 127* 118*  --  139* 134* 114* 152*  BUN 34* 34*  --  35* 40* 42* 42*  CREATININE 2.35* 2.08*  --  1.77* 1.92* 1.60* 1.61*  CALCIUM 8.4* 8.1*  --  8.3* 8.4* 8.5* 8.5*  MG 2.7*  --  2.6* 2.6* 2.7* 2.6* 2.3  PHOS 5.5*  --  4.4 4.2  --  3.6 3.4   GFR: Estimated Creatinine Clearance: 77.8 mL/min (A) (by C-G formula based on SCr of 1.61 mg/dL (H)). Recent Labs  Lab 09/01/2018 2235 09/08/18 0524 09/09/18 1326  09/10/18 0409 09/11/18 0437 09/12/18 0413 09/13/18 0419  PROCALCITON  --   --  <0.10 0.12 0.25  --   --   WBC  --  6.7 8.1 9.0 9.4 9.1 7.7  LATICACIDVEN 1.4 1.3  --   --   --   --   --     Liver Function Tests: Recent Labs  Lab 08/28/2018 2216 09/08/18 1025 09/11/18 0437  AST 32 32 30  ALT _1 ALKPHOS 77 68 59  BILITOT 3.8* 3.2* 2.4*  PROT 7.5 7.3 7.2  ALBUMIN 3.5 3.1* 2.7*   No results for input(s): LIPASE, AMYLASE in the last 168 hours. Recent Labs  Lab  09/08/18 0942 09/09/18 0444 09/11/18 0440  AMMONIA 34 36* 30    ABG    Component Value Date/Time   PHART 7.362 09/10/2018 0345   PCO2ART 45.0 09/10/2018 0345   PO2ART 131.0 (H) 09/10/2018 0345   HCO3 25.2 09/10/2018 0345   TCO2 27 09/10/2018 0345   ACIDBASEDEF 1.0 09/08/2018 2306   O2SAT 99.0 09/10/2018 0345     Coagulation Profile: Recent Labs  Lab 08/29/2018 2216 09/10/18 0409  INR 2.01 1.71    Cardiac Enzymes: Recent Labs  Lab 08/29/2018 2216 09/08/18 0524 09/08/18 1025 09/11/18 0437  TROPONINI <0.03 0.03* <0.03 <0.03    HbA1C: Hgb A1c MFr Bld  Date/Time Value Ref Range Status  09/19/2018 10:35 PM 4.8 4.8 - 5.6 % Final    Comment:    (NOTE) Pre diabetes:          5.7%-6.4% Diabetes:              >6.4% Glycemic control for   <7.0% adults with diabetes     CBG: Recent Labs  Lab 09/12/18 1602 09/12/18 1941 09/12/18 2332 09/13/18 0350 09/13/18 0735  GLUCAP 115* 118* 136* 125* 125*   Critical care time: 30 mins    Nickolas Madrid, NP 09/13/2018  10:03 AM Pager: [phone] or [phone]

## 2018-09-14 LAB — ACID FAST SMEAR (AFB, MYCOBACTERIA): Acid Fast Smear: NEGATIVE

## 2018-09-14 LAB — CULTURE, BAL-QUANTITATIVE W GRAM STAIN
Culture: <10000 — AB
Special Requests: NORMAL

## 2018-09-14 LAB — CULTURE, BAL-QUANTITATIVE

## 2018-09-14 MED ORDER — SCOPOLAMINE 1 MG/3DAYS TD PT72
1.0000 | MEDICATED_PATCH | TRANSDERMAL | Status: DC
  Administered 2018-09-14: 1.5 mg via TRANSDERMAL
  Filled 2018-09-14: qty 1

## 2018-09-14 MED ORDER — MIDAZOLAM HCL 2 MG/2ML IJ SOLN
2.0000 mg | INTRAMUSCULAR | Status: DC | PRN
  Administered 2018-09-14 (×2): 4 mg via INTRAVENOUS
  Filled 2018-09-14 (×2): qty 4

## 2018-09-14 NOTE — Progress Notes (Signed)
..   NAME:  Hodges Treiber, MRN:  [id number], DOB:  [birth year], LOS: 7 ADMISSION DATE:  08/30/2018, CONSULTATION DATE:  09/05/2018 REFERRING MD:  DANFORD,CHRISTOPHER MD, CHIEF COMPLAINT:  RESP DISTRESS   Brief History   57 yr old M presenting from [place]  Was admitted there on 09/03/18. Was being managed for progressive shortness of breath, noncompliant as outpatient due to bad insurance.  Pt ran out of his fluid pills and borrowed someone else's but it was not effective. He was noted to have weight gain and palpitations for a week prior to presentation.   Transferred to the ICU for suspected EtOH withdrawal, respiratory failure requiring intubation.  Past Medical History  Alcoholic Liver cirrhosis Hepatitis C- viral load increased in comparison to prior Esophageal varices- EGD 05/25/18 PE- previously on Eliquis LE DVTs Atrial fibrillation(paroxysmal) DM HTN Diastolic CHF BPH  Significant Hospital Events     Consults:    Procedures:  OETT 12/17 > Lt IJ 12/17 >   Paracentesis - 12/13 in [place] ER 60 cc for diagnostic evaluation followed by 5.5L and received one dose of 25g albumin at that time.  Paracentesis 12/18 > 5 L removed. Albumin x 2 given  Significant Diagnostic Tests:  Chest x-ray 09/11/2018 shows bilateral effusion and vascular congestion vascular crowding with endotracheal tube high. Cardiomegaly with vascular congestion. Low volumes with bibasilar atelectasis and layering effusions. No significant  Change.  12/19 Echo The cavity size was normal. Wall thickness was   normal. Systolic function was normal. The estimated ejection   fraction was in the range of 50% to 55%. - Aortic valve: There was no regurgitation. - Mitral valve: There was no regurgitation. - Left atrium: The atrium was normal in size. - Right atrium: The atrium was normal in size. - Tricuspid valve: There was mild regurgitation. - Pulmonary arteries: Systolic pressure could not be  accurately   estimated.  Micro Data:  Pending Paracentesis results from [place]- per Rn cx was negative. Cell count ( mod WBC) no numbers per RN  - blood cx x 2 - 12/17>> - tracheal cx - 12/17>>GS>> rare gram + cocci, rare gram + variable rod normal flora 09/08/2018 urine culture >>negative 09/09/2018 paracentesis fluid culture>> negative 09/11/2018 BAL>  Antimicrobials:   Ceftriaxone 12/17 (?SBP)>>>   Objective   Blood pressure (!) 50/31, pulse (!) 157, temperature 99.4 F (37.4 C), temperature source Oral, resp. rate 19, height _0  (1.778 m), weight (!) 155.8 kg, SpO2 (!) 40 %.    Vent Mode: PRVC FiO2 (%):  [40 %] 40 % Set Rate:  [20 bmp] 20 bmp Vt Set:  [540 mL] 540 mL PEEP:  [5 cmH20] 5 cmH20 Plateau Pressure:  [19 cmH20] 19 cmH20   Intake/Output Summary (Last 24 hours) at 09/14/2018 1005 Last data filed at 09/14/2018 0600 Gross per 24 hour  Intake 696.72 ml  Output 455 ml  Net 241.72 ml   Filed Weights   09/10/18 0500 09/11/18 0500 09/13/18 0457  Weight: (!) 159.9 kg (!) 162 kg (!) 155.8 kg    Examination: Gen:      No acute distress HEENT:  EOMI, sclera anicteric Neck:     No masses; no thyromegaly Lungs:    Clear to auscultation bilaterally; normal respiratory effort CV:         Regular rate and rhythm; no murmurs Abd:      + bowel sounds; soft, non-tender; no palpable masses, no distension Ext:    No edema; adequate peripheral perfusion Skin:  Warm and dry; no rash Neuro: Sedated, agonal  Assessment & Plan:  57 year old with decompensated liver failure in setting of EtOH cirrhosis, respiratory failure, alcohol withdrawal  After multiple discussions with family he has been transitioned to comfort care Extubated Currently on morphine drip We will add benzos as needed Continue Robinul, add scopolamine patch for secretions.  Transfer to 6 [address]., palliative floor.  Marshell Garfinkel MD  Pulmonary and Critical Care Pager [phone] If no  answer or after 3pm call: [phone] 09/14/2018, 10:07 AM

## 2018-09-14 NOTE — Progress Notes (Signed)
Called Jose Norris to see if Palliative consult was placed by Dr. Silvestre GunnerMannan, she will follow up with him.

## 2018-09-14 NOTE — Progress Notes (Signed)
At present, pt appears comfortable, on morphine drip at 30.

## 2018-09-14 NOTE — Progress Notes (Signed)
Called and got report on pt, asked if in pt best interest to be transferred at this point with low BP, Clarita CraneKeely will talk to her charge nurse about the situation.

## 2018-09-14 NOTE — Progress Notes (Addendum)
1943 patient's wife Sharene ButtersHelen Bermingham called for update, personal medical concerns are keeping her at home.  Updated and assured her she will be called immediately with any changes. Wife appreciated care hospital is providing.   2315 sister at bedside.

## 2018-09-14 NOTE — Progress Notes (Signed)
Attempted to give report to [employer] RN on [address]. Will continue to monitor pt.

## 2018-09-14 NOTE — Progress Notes (Signed)
Pt admitted to 6N25 from 34M via bed.  MSO4 drip going at 30, bolus of 5 mg given.

## 2018-09-16 ENCOUNTER — Telehealth: Payer: Self-pay | Admitting: Pulmonary Disease

## 2018-09-16 NOTE — Telephone Encounter (Signed)
[facility] sending via courier to Dr. Isaiah SergeMannam to sign 09/16/18  LM

## 2018-09-20 ENCOUNTER — Telehealth: Payer: Self-pay

## 2018-09-20 NOTE — Telephone Encounter (Signed)
On 09/20/18 I received the dc back from Doctor Mannam. I got the dc ready and called the funeral home to let them know the dc is ready for pickup. I also faxed a copy to the funeral home per the funeral home request.

## 2018-09-22 NOTE — Discharge Summary (Signed)
Physician Death Summary  Patient ID: Jose HerbRobert Norris MRN: [id number] DOB/AGE: [birth year] 58 y.o.  Admit date: 13-Mar-2018 Discharge date: 09/16/2018  Admission Diagnoses:  Respiratory failure  Discharge Diagnoses:  Respiratory failure Decompensated liver failure Alcoholic Liver cirrhosis Etoh withdrawal Hepatitis C Esophageal varices- EGD 05/25/18 PE- previously on Eliquis LE DVTs Atrial fibrillation(paroxysmal) DM HTN Diastolic CHF BPH  Discharged Condition: Deceased  Hospital Course:  58 yr old [place]  Was admitted there on 09/03/18. Was being managed for progressive shortness of breath, noncompliant as outpatient due to bad insurance.  Pt ran out of his fluid pills and borrowed someone else's but it was not effective. He was noted to have weight gain and palpitations for a week prior to presentation.   Transferred to the ICU for suspected EtOH withdrawal, respiratory failure requiring intubation.  Patient continued to be critically ill with shock, respiratory failure requiring pressor support.  He also went into atrial fibrillation with rapid ventricular rate which is difficult to control.  He was put on Cardizem drip.  Underwent large-volume paracentesis x2. Family meeting was held on 12/23 and clinical course discussed. They wished to transition to comfort care and stop all medical treatment He was extubated, given morphine for comfort and passed away on 12/25 at 1:50 am  Signed: Rosaleigh Brazzel 09/16/2018, 11:11 AM

## 2018-09-22 NOTE — Progress Notes (Addendum)
Checked on patient, shallow quiet breaths then ceased. Second nurse Lovely verified. Time of death [id number]. Paged provider, called wife Sharene ButtersHelen Trefz at both numbers listed with no answer. Will continue to try to reach family.   0230 wife Myriam JacobsonHelen returned call. Does not wish to come here tonight.

## 2018-09-22 NOTE — Progress Notes (Signed)
0230 wasted 70mL morphine with Learta CoddingNancy I. RN

## 2018-09-22 DEATH — deceased

## 2018-10-13 LAB — FUNGUS CULTURE RESULT

## 2018-10-13 LAB — FUNGUS CULTURE WITH STAIN

## 2018-10-13 LAB — FUNGAL ORGANISM REFLEX

## 2018-10-27 LAB — ACID FAST CULTURE WITH REFLEXED SENSITIVITIES (MYCOBACTERIA): Acid Fast Culture: NEGATIVE

## 2019-05-16 IMAGING — DX DG CHEST 1V PORT
1 series · 1 of 1 positions shown · non-contrast
Comparison: 09/07/2018

CLINICAL DATA: Check endotracheal tube placement

EXAM:
PORTABLE CHEST 1 VIEW

[chest ap]
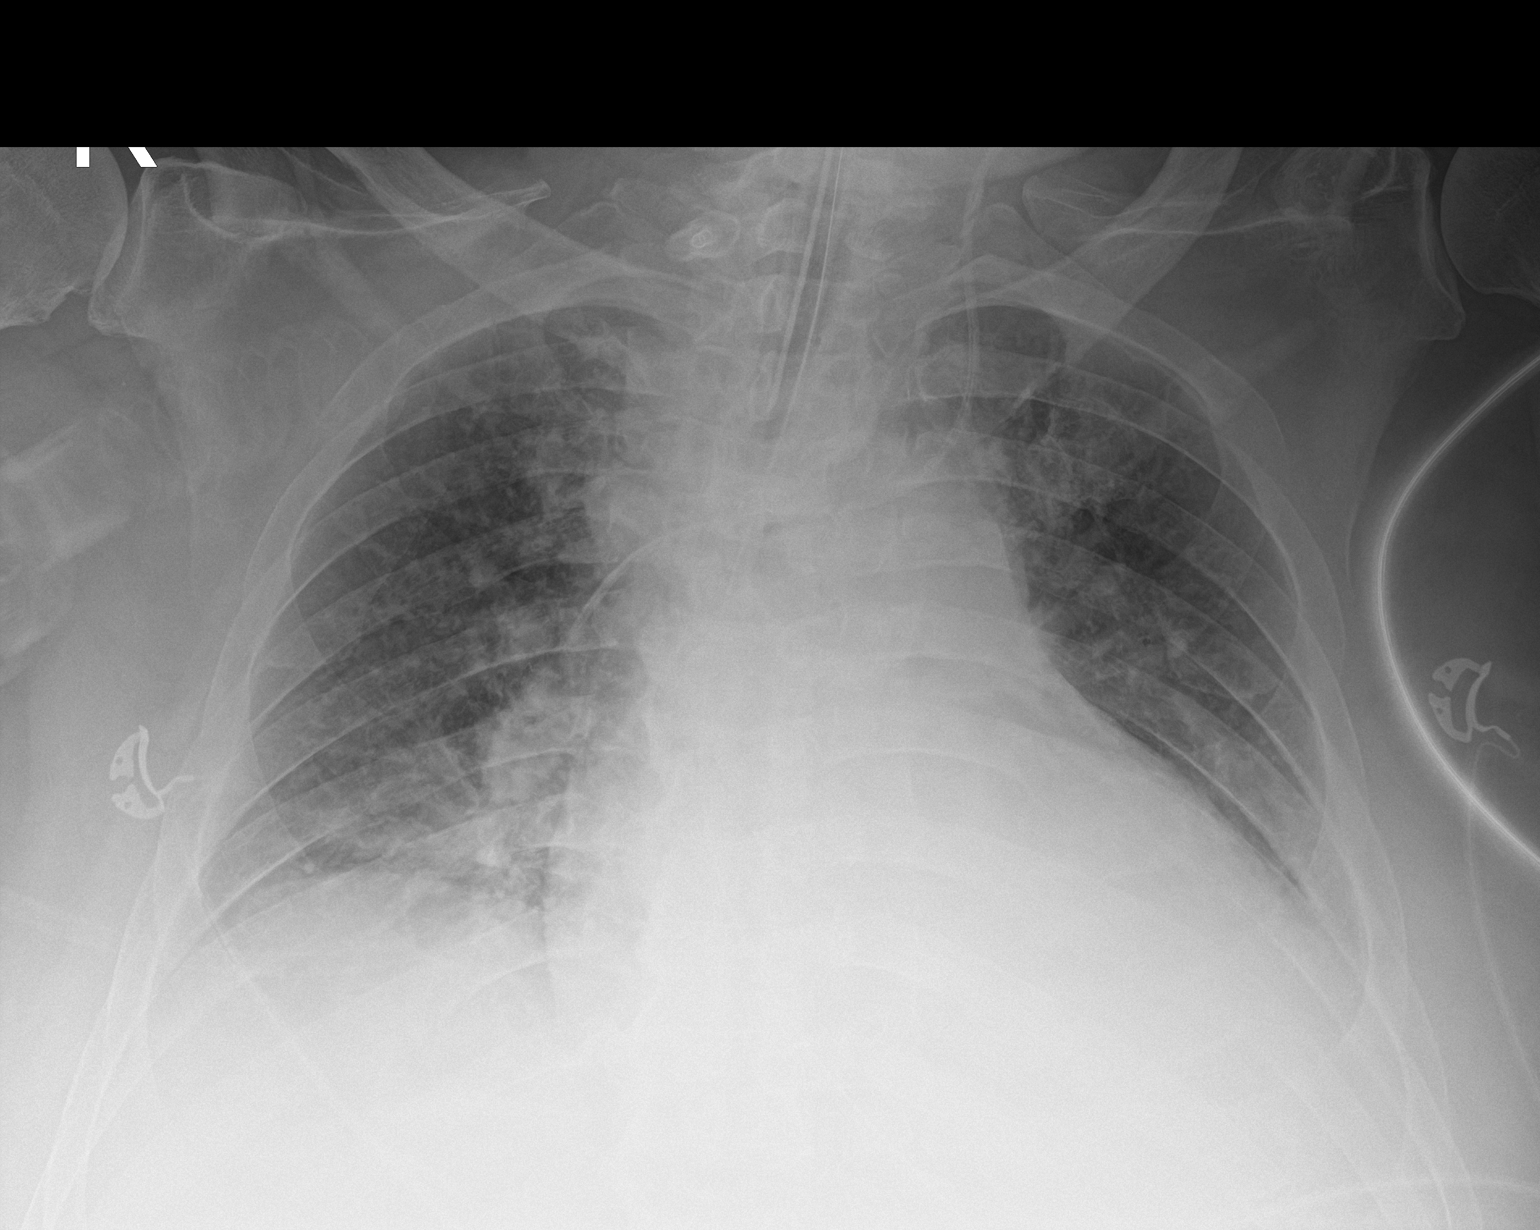

[1 of 1 positions shown; findings below may reference images not displayed]

FINDINGS: Endotracheal tube and gastric catheter are noted and stable.. The
left jugular central line tip now lies in the mid superior vena
cava. The overall inspiratory effort is poor with mild bibasilar
atelectasis slightly increased from the prior exam consistent with
the poor inspiratory effort. No bony abnormality is noted.
IMPRESSION: Increasing atelectasis secondary to a poor inspiratory effort.

## 2019-05-18 IMAGING — DX DG CHEST 1V PORT
1 series · 1 of 1 positions shown · non-contrast
Comparison: 09/10/2018

CLINICAL DATA: Acute respiratory failure. On ventilator. Septic
shock.

EXAM:
PORTABLE CHEST 1 VIEW

[chest ap]
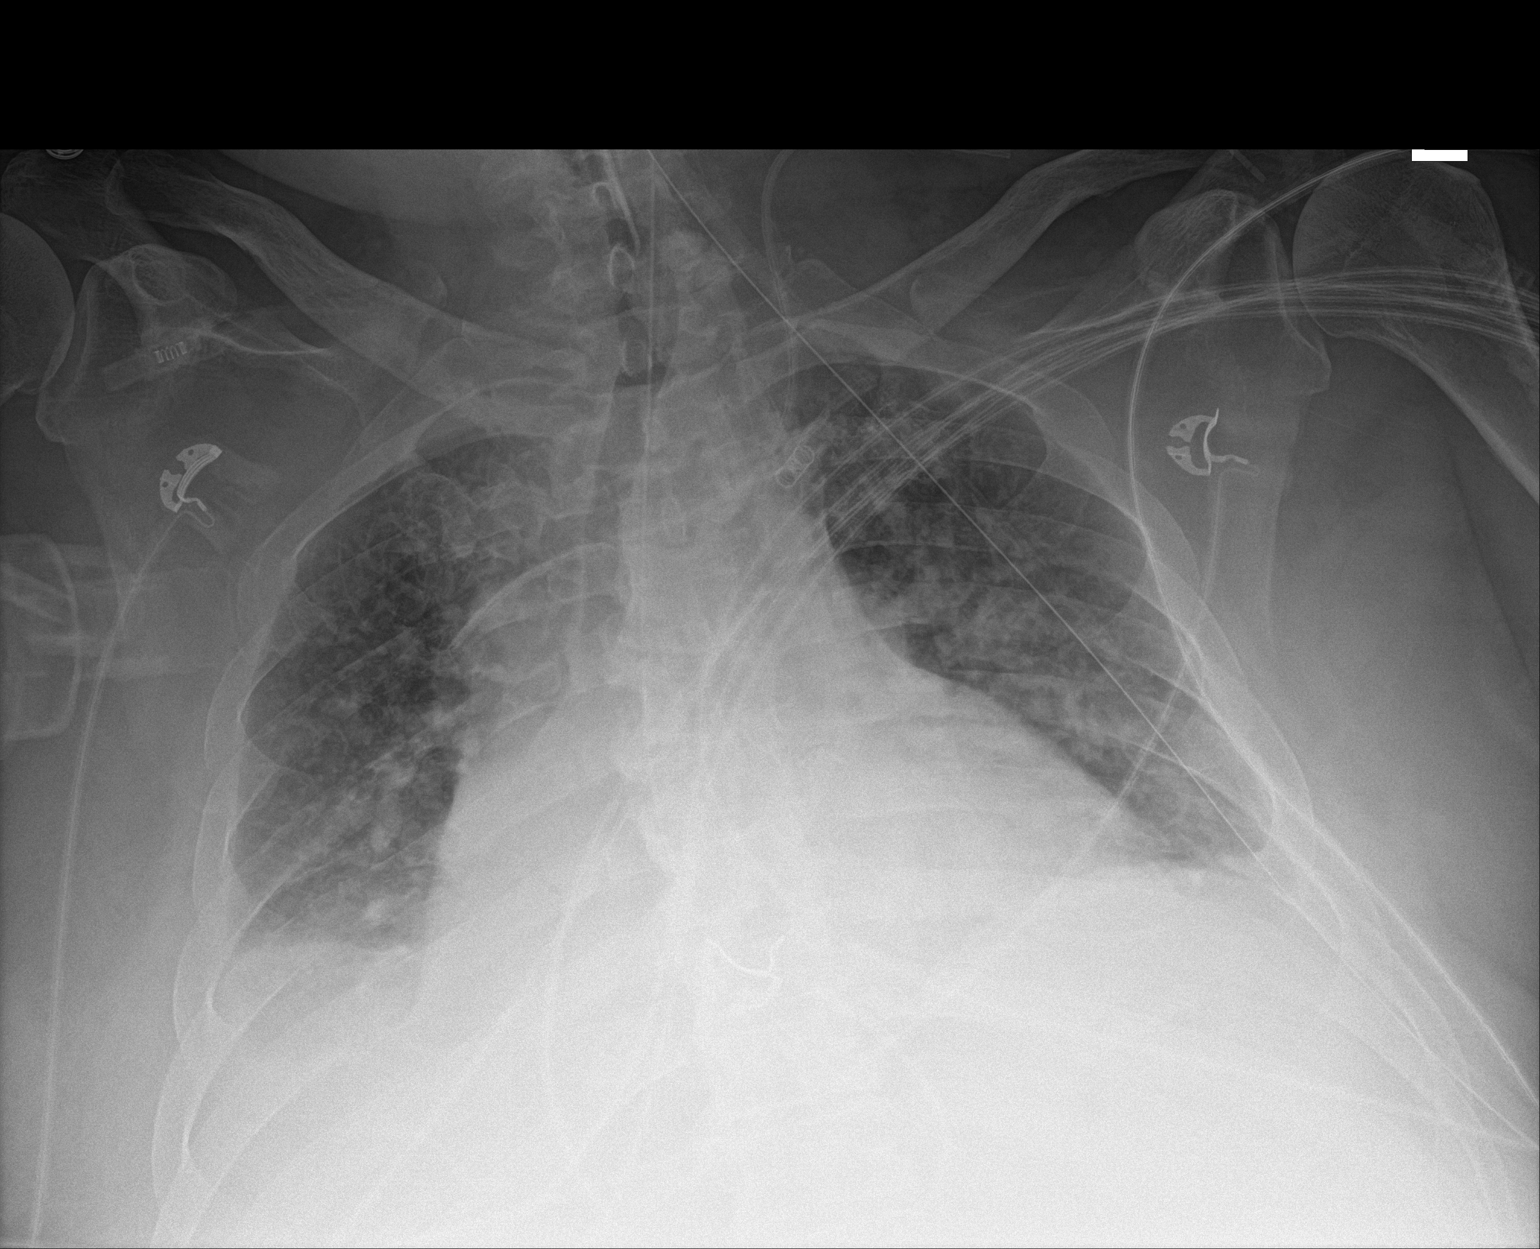

[1 of 1 positions shown; findings below may reference images not displayed]

FINDINGS: Endotracheal tube is high in position, with distal tip approximately
2.5 cm above the carina. Nasogastric tube is seen entering the
stomach. Left jugular central venous catheter tip remains along the
lateral side wall of the SVC. No pneumothorax visualized.

Cardiomegaly remains stable. Diffuse interstitial infiltrates are
also unchanged. Probable small subpulmonic bilateral pleural
effusions, also stable.
IMPRESSION: High endotracheal tube position.

Stable cardiomegaly, diffuse interstitial edema, and bilateral
pleural effusions.

## 2019-05-20 IMAGING — DX DG CHEST 1V PORT
1 series · 1 of 1 positions shown · non-contrast
Comparison: One-view chest x-ray 09/12/2018

CLINICAL DATA: Respiratory failure.  Shortness of breath.

EXAM:
PORTABLE CHEST 1 VIEW

[chest ap]
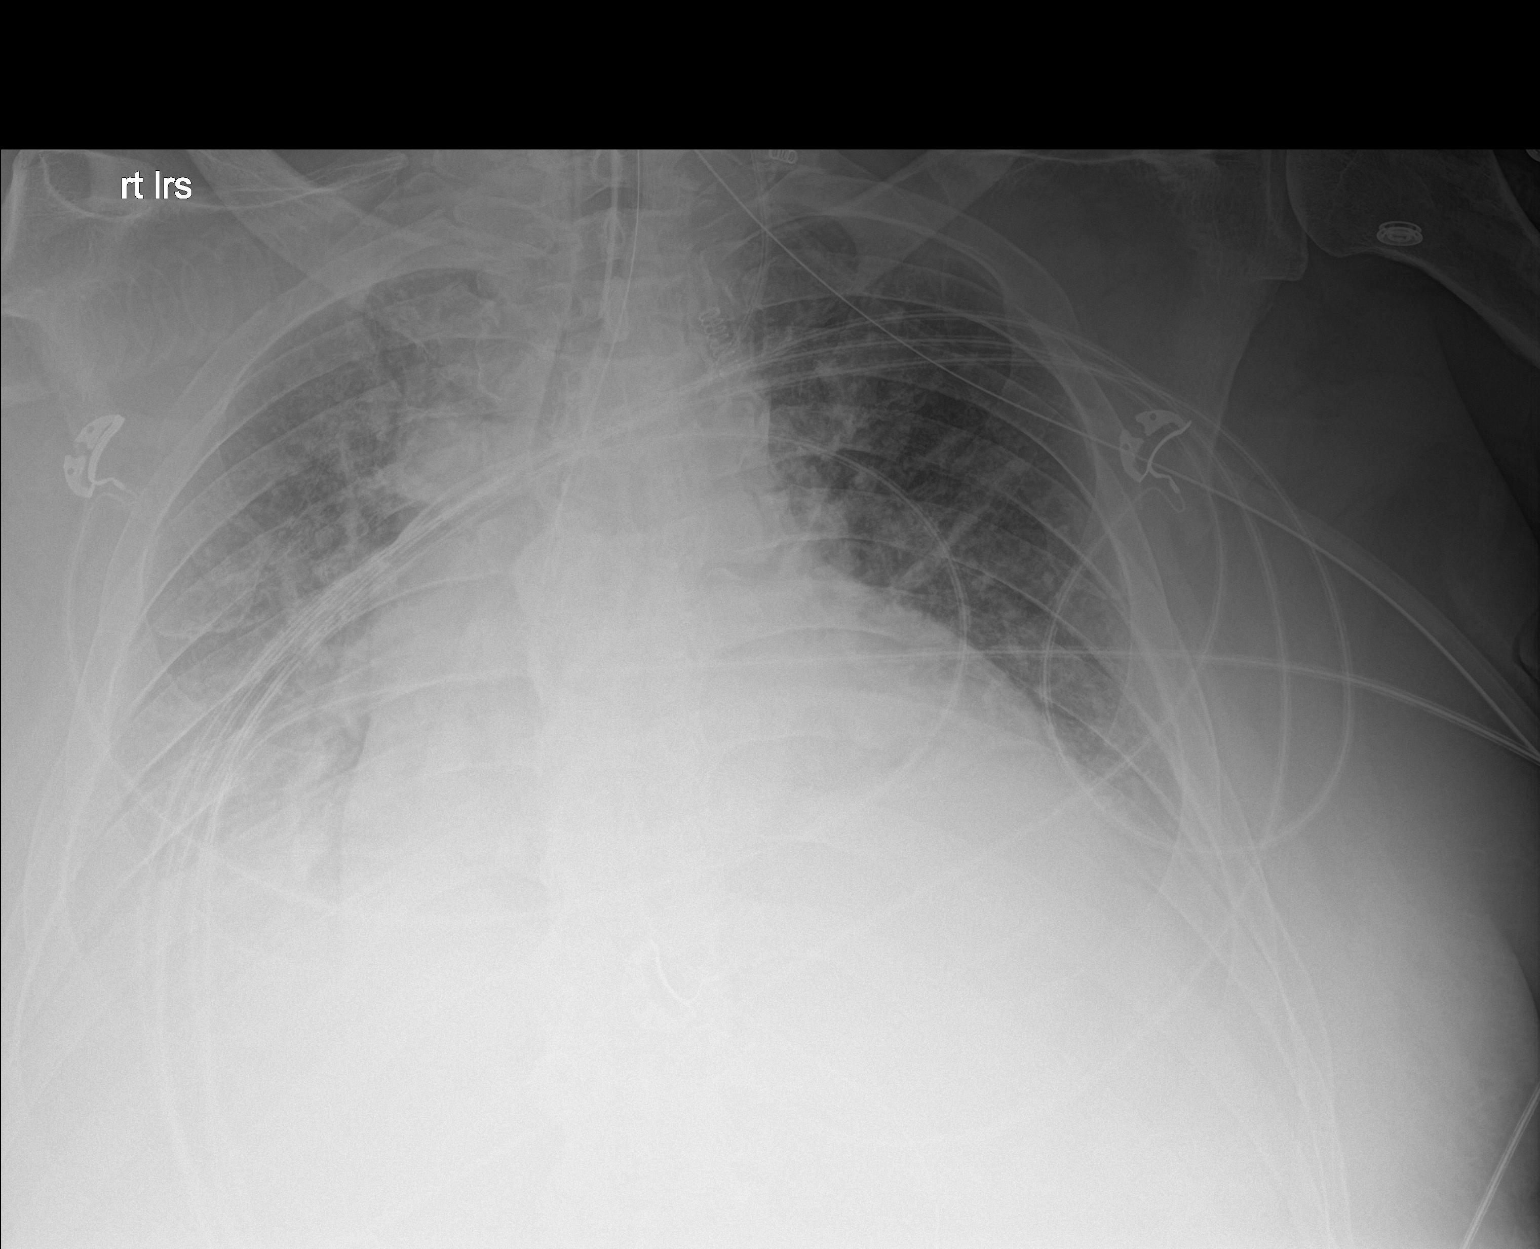

[1 of 1 positions shown; findings below may reference images not displayed]

FINDINGS: Heart is enlarged. Tracheal tube terminates the level the clavicles.
NG tube courses off the inferior border the film.

Interstitial edema has increased slightly. Bilateral effusions and
atelectasis are noted.
IMPRESSION: 1. Creasing interstitial edema and congestive heart failure.
2. Support apparatus is stable.
